# Patient Record
Sex: Male | Born: 1965 | Race: White | Hispanic: No | Marital: Married | Smoking: Never smoker
Health system: Southern US, Community
[De-identification: ages and names within clinical notes are randomized; demographics above are authoritative.]

## PROBLEM LIST (undated history)

## (undated) DIAGNOSIS — E039 Hypothyroidism, unspecified: Secondary | ICD-10-CM

## (undated) DIAGNOSIS — R0602 Shortness of breath: Secondary | ICD-10-CM

## (undated) DIAGNOSIS — I209 Angina pectoris, unspecified: Secondary | ICD-10-CM

## (undated) DIAGNOSIS — K219 Gastro-esophageal reflux disease without esophagitis: Secondary | ICD-10-CM

## (undated) DIAGNOSIS — R5383 Other fatigue: Secondary | ICD-10-CM

## (undated) DIAGNOSIS — R0600 Dyspnea, unspecified: Secondary | ICD-10-CM

## (undated) HISTORY — DX: Shortness of breath: R06.02

## (undated) HISTORY — PX: NO PAST SURGERIES: SHX2092

## (undated) HISTORY — DX: Other fatigue: R53.83

---

## 2008-12-23 ENCOUNTER — Encounter: Admission: RE | Admit: 2008-12-23 | Discharge: 2008-12-23 | Payer: Self-pay | Admitting: Endocrinology

## 2012-06-16 ENCOUNTER — Encounter: Payer: Self-pay | Admitting: Cardiology

## 2012-06-17 ENCOUNTER — Encounter: Payer: Self-pay | Admitting: *Deleted

## 2015-02-14 ENCOUNTER — Other Ambulatory Visit: Payer: Self-pay

## 2015-02-14 DIAGNOSIS — I83811 Varicose veins of right lower extremities with pain: Secondary | ICD-10-CM

## 2015-03-13 ENCOUNTER — Encounter: Payer: Self-pay | Admitting: Vascular Surgery

## 2015-03-14 ENCOUNTER — Encounter: Payer: Self-pay | Admitting: Vascular Surgery

## 2015-03-14 ENCOUNTER — Encounter (HOSPITAL_COMMUNITY): Payer: Self-pay

## 2015-04-19 ENCOUNTER — Ambulatory Visit: Payer: Self-pay | Admitting: Sports Medicine

## 2015-06-13 ENCOUNTER — Encounter: Payer: Self-pay | Admitting: *Deleted

## 2015-06-15 ENCOUNTER — Encounter: Payer: Self-pay | Admitting: Cardiovascular Disease

## 2015-06-15 ENCOUNTER — Ambulatory Visit (INDEPENDENT_AMBULATORY_CARE_PROVIDER_SITE_OTHER): Payer: 59 | Admitting: Cardiovascular Disease

## 2015-06-15 VITALS — BP 112/64 | HR 52 | Ht 70.0 in | Wt 168.0 lb

## 2015-06-15 DIAGNOSIS — R5382 Chronic fatigue, unspecified: Secondary | ICD-10-CM

## 2015-06-15 DIAGNOSIS — R5383 Other fatigue: Secondary | ICD-10-CM | POA: Diagnosis not present

## 2015-06-15 DIAGNOSIS — R0602 Shortness of breath: Secondary | ICD-10-CM | POA: Diagnosis not present

## 2015-06-15 DIAGNOSIS — R002 Palpitations: Secondary | ICD-10-CM

## 2015-06-15 DIAGNOSIS — R0600 Dyspnea, unspecified: Secondary | ICD-10-CM

## 2015-06-15 DIAGNOSIS — R5381 Other malaise: Secondary | ICD-10-CM

## 2015-06-15 NOTE — Progress Notes (Signed)
Cardiology Office Note   Date:  06/15/2015   ID:  Nathaniel Oliver, DOB Oct 31, 1965, MRN [id number]  PCP:  PROVIDER NOT IN SYSTEM  Cardiologist:   Anyely Cunning, Wonda Cheng, MD   Chief Complaint  Patient presents with  . Palpitations   Problem list: 1. Palpitations 2. Shortness of breath with exercise 3. hypercalcemia   History of Present Illness: Nathaniel Oliver is a 49 y.o. male who presents for palpitations and fatigue Started on late June. Noticed extreme fatigue with cycling .  Is a competitive cyclist. Previously rode for [employer].  Also lots of fatigue early in the morning .  Has cut back his cycling quite a bit.   Now has profound fatigue 45 minutes into  A ride.  3 months ago, he could ride for 3-4 hours at a time without problems.   Has had some palpitations that last for a few seconds  Was hypercalcemic at the time   Non smoker Drinks wine occasionally .     Past Medical History  Diagnosis Date  . Chest pain   . SOB (shortness of breath)   . Fatigue     History reviewed. No pertinent past surgical history.   Current Outpatient Prescriptions  Medication Sig Dispense Refill  . EPINEPHrine (EPIPEN 2-PAK) 0.3 mg/0.3 mL IJ SOAJ injection Inject 1 Device into the muscle once as needed. Inject into thigh as need for allergic reaction.Seek medical attention after using.    Marland Kitchen levothyroxine (SYNTHROID, LEVOTHROID) 25 MCG tablet Take 25 mcg by mouth daily.    . Multiple Vitamin (MULTIVITAMIN) tablet Take 1 tablet by mouth daily.     No current facility-administered medications for this visit.    Allergies:   Fish allergy    Social History:  The patient  reports that he has never smoked. He does not have any smokeless tobacco history on file. He reports that he drinks about 1.8 oz of alcohol per week.   Family History:  The patient's family history includes Healthy in his brother and sister; Hyperlipidemia in his mother; Hypertension in his mother; Pancreatic cancer in  his father; Stroke in his mother.    ROS:  Please see the history of present illness.    Review of Systems: Constitutional:  denies fever, chills, diaphoresis, appetite change.  He does have profound  fatigue.  HEENT: denies photophobia, eye pain, redness, hearing loss, ear pain, congestion, sore throat, rhinorrhea, sneezing, neck pain, neck stiffness and tinnitus.  Respiratory: denies SOB, DOE, cough, chest tightness, and wheezing.  Cardiovascular: denies chest pain, palpitations and leg swelling.  Gastrointestinal: denies nausea, vomiting, abdominal pain, diarrhea, constipation, blood in stool.  Genitourinary: denies dysuria, urgency, frequency, hematuria, flank pain and difficulty urinating.  Musculoskeletal: denies  myalgias, back pain, joint swelling, arthralgias and gait problem.   Skin: denies pallor, rash and wound.  Neurological: denies dizziness, seizures, syncope, weakness, light-headedness, numbness and headaches.   Hematological: denies adenopathy, easy bruising, personal or family bleeding history.  Psychiatric/ Behavioral: denies suicidal ideation, mood changes, confusion, nervousness, sleep disturbance and agitation.      All other systems are reviewed and negative.    PHYSICAL EXAM: VS:  BP 112/64 mmHg  Pulse 52  Ht 5\' 10"  (1.778 m)  Wt 76.204 kg (168 lb)  BMI 24.11 kg/m2 , BMI Body mass index is 24.11 kg/(m^2). GEN: Well nourished, well developed, in no acute distress. Very fit appearing .  HEENT: normal Neck: no JVD, carotid bruits, or masses Cardiac: RRR; no  murmurs, rubs, or gallops,no edema  Respiratory:  clear to auscultation bilaterally, normal work of breathing GI: soft, nontender, nondistended, + BS MS: no deformity or atrophy Skin: warm and dry, no rash Neuro:  Strength and sensation are intact Psych: normal   EKG:  EKG is ordered today. The ekg ordered today demonstrates  Sinus brady at 52.  Normal ecg    Recent Labs: No results found for  requested labs within last 365 days.    Lipid Panel No results found for: CHOL, TRIG, HDL, CHOLHDL, VLDL, LDLCALC, LDLDIRECT    Wt Readings from Last 3 Encounters:  06/15/15 76.204 kg (168 lb)      Other studies Reviewed: Additional studies/ records that were reviewed today include: . Review of the above records demonstrates:    ASSESSMENT AND PLAN:  1.  Fatigue/shortness breath: [name]  presents with profound fatigue and shortness breath with exercise. He is a competitive cyclist and used to be able to ride 3-4 hours at a time. He now becomes very fatigued 45 minutes. He's not able to ride nearly as far as he could several months ago.   has had some palpitations. He has shortness of breath and fatigue. Fatigues seems to start in his legs.  Think the best approach is to do an echocardiogram to evaluate him for cardiac disease. I will also like to do a stress echo.  I'll see him back in the office in 2-3 months for follow-up visit.    Current medicines are reviewed at length with the patient today.  The patient does not have concerns regarding medicines.  The following changes have been made:  no change  Labs/ tests ordered today include:   Orders Placed This Encounter  Procedures  . EKG 12-Lead  . Echocardiogram  . Echo stress     Disposition:   FU with me in 2-3 months     Lynisha Osuch, Wonda Cheng, MD  06/15/2015 11:29 AM    [facility] [place], [place], [place]  [address] Phone: [phone]; Fax: [phone]   [facility]  [address] [place], [place]  [address] [phone]   Fax [phone]

## 2015-06-15 NOTE — Patient Instructions (Signed)
Medication Instructions:  Your physician recommends that you continue on your current medications as directed. Please refer to the Current Medication list given to you today.   Labwork: None Ordered   Testing/Procedures: Your physician has requested that you have an echocardiogram. Echocardiography is a painless test that uses sound waves to create images of your heart. It provides your doctor with information about the size and shape of your heart and how well your heart's chambers and valves are working. This procedure takes approximately one hour. There are no restrictions for this procedure.  Your physician has requested that you have a stress echocardiogram. For further information please visit [email or web address]. Please follow instruction sheet as given.   Follow-Up: Your physician recommends that you schedule a follow-up appointment in: 2-3 months with Dr. Acie Fredrickson.

## 2015-06-27 ENCOUNTER — Ambulatory Visit (HOSPITAL_COMMUNITY): Payer: 59 | Attending: Cardiovascular Disease

## 2015-06-27 ENCOUNTER — Other Ambulatory Visit: Payer: Self-pay

## 2015-06-27 ENCOUNTER — Ambulatory Visit (HOSPITAL_BASED_OUTPATIENT_CLINIC_OR_DEPARTMENT_OTHER): Payer: 59

## 2015-06-27 ENCOUNTER — Other Ambulatory Visit: Payer: Self-pay | Admitting: Cardiovascular Disease

## 2015-06-27 DIAGNOSIS — R079 Chest pain, unspecified: Secondary | ICD-10-CM

## 2015-06-27 DIAGNOSIS — R5383 Other fatigue: Secondary | ICD-10-CM | POA: Diagnosis not present

## 2015-06-27 DIAGNOSIS — R0600 Dyspnea, unspecified: Secondary | ICD-10-CM | POA: Diagnosis not present

## 2015-06-27 DIAGNOSIS — R002 Palpitations: Secondary | ICD-10-CM | POA: Diagnosis not present

## 2015-06-28 ENCOUNTER — Telehealth: Payer: Self-pay | Admitting: Nurse Practitioner

## 2015-06-28 NOTE — Telephone Encounter (Signed)
-----   Message from Thayer Headings, MD sent at 06/28/2015  1:37 PM EDT ----- Echo and stress echo are normal . His main symptom is fatigue. He has had his TSH measured recently. Lets refer him to Dr. Mylinda Latina at [facility] .

## 2015-06-28 NOTE — Telephone Encounter (Signed)
Echo and stress echo results were reviewed with patient by Dr. Acie Fredrickson.  I am sending request for referral to Dr. Tye Savoy at [facility] in [place] to [facility] for fatigue.

## 2015-07-04 ENCOUNTER — Other Ambulatory Visit: Payer: Self-pay | Admitting: Nurse Practitioner

## 2015-07-04 DIAGNOSIS — R5383 Other fatigue: Secondary | ICD-10-CM

## 2015-07-04 NOTE — Telephone Encounter (Signed)
Spoke with patient and advised that one of our schedulers called [facility] and was advised that they do not take referrals; advised patient call them to schedule.  I advised him to call back if we can help him in any way.  Patient verbalized understanding and agreement.

## 2015-07-04 NOTE — Telephone Encounter (Signed)
Follow Up ° °Pt returned call/sr  °

## 2015-08-23 ENCOUNTER — Ambulatory Visit: Payer: 59 | Admitting: Cardiovascular Disease

## 2018-05-15 ENCOUNTER — Other Ambulatory Visit: Payer: Self-pay | Admitting: Family Medicine

## 2018-05-15 DIAGNOSIS — R945 Abnormal results of liver function studies: Principal | ICD-10-CM

## 2018-05-15 DIAGNOSIS — R7989 Other specified abnormal findings of blood chemistry: Secondary | ICD-10-CM

## 2018-05-29 ENCOUNTER — Ambulatory Visit
Admission: RE | Admit: 2018-05-29 | Discharge: 2018-05-29 | Disposition: A | Discharge status: Home / Self Care | Payer: BLUE CROSS/BLUE SHIELD | Source: Ambulatory Visit | Attending: Family Medicine | Admitting: Family Medicine

## 2018-05-29 ENCOUNTER — Other Ambulatory Visit: Payer: Self-pay | Admitting: Family Medicine

## 2018-05-29 DIAGNOSIS — R945 Abnormal results of liver function studies: Principal | ICD-10-CM

## 2018-05-29 DIAGNOSIS — R7989 Other specified abnormal findings of blood chemistry: Secondary | ICD-10-CM

## 2021-01-03 ENCOUNTER — Inpatient Hospital Stay: Payer: No Typology Code available for payment source | Attending: Hematology | Admitting: Hematology

## 2021-01-03 ENCOUNTER — Other Ambulatory Visit: Payer: Self-pay

## 2021-01-03 ENCOUNTER — Inpatient Hospital Stay: Payer: No Typology Code available for payment source

## 2021-01-03 ENCOUNTER — Telehealth: Payer: Self-pay | Admitting: Hematology

## 2021-01-03 VITALS — BP 118/72 | HR 63 | Temp 97.5°F | Resp 18 | Ht 70.0 in | Wt 171.3 lb

## 2021-01-03 DIAGNOSIS — C880 Waldenstrom macroglobulinemia: Secondary | ICD-10-CM

## 2021-01-03 LAB — CBC WITH DIFFERENTIAL/PLATELET
Abs Immature Granulocytes: 0.01 10*3/uL (ref 0.00–0.07)
Basophils Absolute: 0.1 10*3/uL (ref 0.0–0.1)
Basophils Relative: 1 %
Eosinophils Absolute: 0.1 10*3/uL (ref 0.0–0.5)
Eosinophils Relative: 1 %
HCT: 41.3 % (ref 39.0–52.0)
Hemoglobin: 14.4 g/dL (ref 13.0–17.0)
Immature Granulocytes: 0 %
Lymphocytes Relative: 16 %
Lymphs Abs: 1.4 10*3/uL (ref 0.7–4.0)
MCH: 32.1 pg (ref 26.0–34.0)
MCHC: 34.9 g/dL (ref 30.0–36.0)
MCV: 92 fL (ref 80.0–100.0)
Monocytes Absolute: 0.6 10*3/uL (ref 0.1–1.0)
Monocytes Relative: 7 %
Neutro Abs: 6.4 10*3/uL (ref 1.7–7.7)
Neutrophils Relative %: 75 %
Platelets: 303 10*3/uL (ref 150–400)
RBC: 4.49 MIL/uL (ref 4.22–5.81)
RDW: 11.9 % (ref 11.5–15.5)
WBC: 8.5 10*3/uL (ref 4.0–10.5)
nRBC: 0 % (ref 0.0–0.2)

## 2021-01-03 LAB — CMP (CANCER CENTER ONLY)
ALT: 15 U/L (ref 0–44)
AST: 21 U/L (ref 15–41)
Albumin: 4.2 g/dL (ref 3.5–5.0)
Alkaline Phosphatase: 52 U/L (ref 38–126)
Anion gap: 10 (ref 5–15)
BUN: 11 mg/dL (ref 6–20)
CO2: 27 mmol/L (ref 22–32)
Calcium: 9.6 mg/dL (ref 8.9–10.3)
Chloride: 103 mmol/L (ref 98–111)
Creatinine: 1.02 mg/dL (ref 0.61–1.24)
GFR, Estimated: >60 mL/min (ref >60)
Glucose, Bld: 108 mg/dL — ABNORMAL HIGH (ref 70–99)
Potassium: 3.9 mmol/L (ref 3.5–5.1)
Sodium: 140 mmol/L (ref 135–145)
Total Bilirubin: 1 mg/dL (ref 0.3–1.2)
Total Protein: 8.2 g/dL — ABNORMAL HIGH (ref 6.5–8.1)

## 2021-01-03 LAB — APTT: aPTT: 29 seconds (ref 24–36)

## 2021-01-03 LAB — LACTATE DEHYDROGENASE: LDH: 128 U/L (ref 98–192)

## 2021-01-03 LAB — SEDIMENTATION RATE: Sed Rate: 54 mm/hr — ABNORMAL HIGH (ref 0–16)

## 2021-01-03 LAB — FIBRINOGEN: Fibrinogen: 274 mg/dL (ref 210–475)

## 2021-01-03 LAB — PROTIME-INR
INR: 1.1 (ref 0.8–1.2)
Prothrombin Time: 13.8 seconds (ref 11.4–15.2)

## 2021-01-03 MED ORDER — TRAZODONE HCL 50 MG PO TABS: 50.0000 mg | ORAL_TABLET | Freq: Every evening | ORAL | 0 refills | Status: DC | PRN

## 2021-01-03 NOTE — Progress Notes (Signed)
HEMATOLOGY/ONCOLOGY CONSULTATION NOTE  Date of Service: 01/03/2021  Patient Care Team: System, Provider Not In as PCP - General  CHIEF COMPLAINTS/PURPOSE OF CONSULTATION:  Abnormal M-Spike and IgM levels  HISTORY OF PRESENTING ILLNESS:   Nathaniel Oliver is a wonderful 55 y.o. male who has been referred to [place] by Roe Coombs, [place] at [place] for evaluation and management of abnormal serum protein. The pt reports that he is doing well overall. We are joined today by his wife.  The pt reports that he has a history of hypothyroidism. The pt notes that he had Shingles in July 2021 and everything was normal prior to this. The pt notes he had visited the doctor one week prior and felt great. This was fairly bad on his back and was not triggered by any steroid use. The pt notes he had recently had more work stress and personal stress with addition of a puppy. The pt notes he recovered in 4 weeks. The pt notes that again in November he experienced a cold for two days. The pt then notes that he felt a bronchial spasm with fluttering in his heart in December, helped with antibiotics. The pt notes he never used to be sick prior. The pt notes that his urine has also been brown and foamy persistent since around the same time as Shingles. The pt notes this is worse in the morning and night. He notes this is different than what he was used to, but not overly foamy. The pt notes that since these recent finding, he has not slept well and has been very anxious regarding the results and potential diagnosis. The pt also notes that he had a past medical issue with lack of recovery in 2016 and had to stop his competitive cycling. He would ride for an hour-1.5 hours then get extremely fatigued when he would push himself. This did not affect his regular day life. The pt notes that on blood work he had EBV antibody elevated. They were checking for chronic fatigue syndrome in doing all of these lab tests. He notes  they could not explain his extreme fatigue and change from baseline. The pt notes that since then he has been on many vitamins and supplements that have been helping him. The pt notes that currently he lifts weight and is feeling fine. The pt notes bronchial allergies as a kid, but no asthma. He denies needing inhalers for this. The pt has been having hypothyroidism since 2010, but is unknown of what the cause is. The pt denies any medication allergies or surgeries in the past. The pt notes he has been on testosterone since 2016 when he was experiencing fatigue. He is using .5cc of the intramuscular injections every 4-5 days. The pt notes that his integrative doctor is monitoring this and his levels are now in the 700s. The pt notes his father passed away from pancreatic cancer in his mid 10s, mother had hypertension and high cholesterol and history of familial stroke. The pt notes his father smoked over a pack a day for an extended period of time. The pt has had the [facility] testing that checks for mutations and found nothing detected.   The pt denies any history of smoking and notes he would occasionally drink beers on weekends. He notes that in his adult life he has been very healthy and proactive. The pt notes that the fluttering is improved, but occurs when he is around his dogs. The pt has three big  dogs that are downstairs dogs. The pt notes that he has had GERD since his mid 37s and gets bloated after eating. 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  Lab results 12/21/2020 showed an m-spike of 1.8 and IgM of 2,397. 12/21/2020 CMP WNL. 11/20/2020 CBC WNL.  On review of systems, pt reports bloating, difficulty sleeping and denies fatigue, fevers, chills, night sweats, sudden weight loss, new lumps/bumps, skin rashes, persistent pain in area of shingles, change in vision, acute headaches, floaters, sudden change in vision, acute SOB, chest pain, leg swelling, abdominal pain, back  pain, acute bone pains, testicular pain/swelling, and any other symptoms.  MEDICAL HISTORY:  Past Medical History:  Diagnosis Date  . Chest pain   . Fatigue   . SOB (shortness of breath)     SURGICAL HISTORY: No past surgical history on file.  SOCIAL HISTORY: Social History   Socioeconomic History  . Marital status: Married    Spouse name: Not on file  . Number of children: Not on file  . Years of education: Not on file  . Highest education level: Not on file  Occupational History  . Not on file  Tobacco Use  . Smoking status: Never Smoker  . Smokeless tobacco: Not on file  Substance and Sexual Activity  . Alcohol use: Yes    Alcohol/week: 3.0 standard drinks    Types: 3 Cans of beer per week  . Drug use: Not on file  . Sexual activity: Not on file  Other Topics Concern  . Not on file  Social History Narrative  . Not on file   Social Determinants of Health   Financial Resource Strain: Not on file  Food Insecurity: Not on file  Transportation Needs: Not on file  Physical Activity: Not on file  Stress: Not on file  Social Connections: Not on file  Intimate Partner Violence: Not on file    FAMILY HISTORY: Family History  Problem Relation Age of Onset  . Hypertension Mother   . Stroke Mother   . Hyperlipidemia Mother   . Pancreatic cancer Father   . Healthy Sister   . Healthy Brother     ALLERGIES:  is allergic to fish allergy.  MEDICATIONS:  Current Outpatient Medications  Medication Sig Dispense Refill  . EPINEPHrine (EPIPEN 2-PAK) 0.3 mg/0.3 mL IJ SOAJ injection Inject 1 Device into the muscle once as needed. Inject into thigh as need for allergic reaction.Seek medical attention after using.    Marland Kitchen levothyroxine (SYNTHROID, LEVOTHROID) 25 MCG tablet Take 25 mcg by mouth daily.    . Multiple Vitamin (MULTIVITAMIN) tablet Take 1 tablet by mouth daily.     No current facility-administered medications for this visit.    REVIEW OF SYSTEMS:   10 Point  review of Systems was done is negative except as noted above.  PHYSICAL EXAMINATION: ECOG PERFORMANCE STATUS: 1 - Symptomatic but completely ambulatory  . Vitals:   01/03/21 1253  BP: 118/72  Pulse: 63  Resp: 18  Temp: (!) 97.5 F (36.4 C)  SpO2: 99%   Filed Weights   01/03/21 1253  Weight: 171 lb 4.8 oz (77.7 kg)   .Body mass index is 24.58 kg/m.  GENERAL:alert, in no acute distress and comfortable SKIN: no acute rashes, no significant lesions EYES: conjunctiva are pink and non-injected, sclera anicteric OROPHARYNX: MMM, no exudates, no oropharyngeal erythema or ulceration NECK: supple, no JVD LYMPH:  no palpable lymphadenopathy in the cervical, axillary or inguinal regions LUNGS:  clear to auscultation b/l with normal respiratory effort HEART: regular rate & rhythm ABDOMEN:  normoactive bowel sounds , non tender, not distended. Extremity: no pedal edema PSYCH: alert & oriented x 3 with fluent speech NEURO: no focal motor/sensory deficits  LABORATORY DATA:  I have reviewed the data as listed  .No flowsheet data found.  .No flowsheet data found.   RADIOGRAPHIC STUDIES: I have personally reviewed the radiological images as listed and agreed with the findings in the report. No results found.  ASSESSMENT & PLAN:   55 yo with   1) IgM kappa Monoclonal paraproteinemia r/o Waldenstroms macroglobulinemia No symptoms suggestive of hyperviscosity syndorme Normal CBC with no cytopenias Normal CMP PLAN: -Advised pt that elevated EBV antibody is not uncommon if somebody had infection of Mono when he was younger and not concerning. These levels stay chronically elevated and can be elevated due to immune suppression as well. -Advised pt that most common cause of hypothyroidism is Hashimoto's Thyroidism. -Discussed albumins versus globulins. Advised pt that usually they are in balance.  -Discussed pt's lab findings-- he has an m-spike of 1.8 and IgM monoclonal elevation of  2,397. This tells [place] that there is one protein much more elevated than the others and that is the IgM Kappa in the pt's case.  -Advised pt that these findings are most suggestive of a WBC overgrowing and making more of one protein. The amount is significant enough to get further testing and potential treatment. -Discussed IgM MGUS-- no constitutional symptoms (fatigue, fevers, chills, weight loss, enlarged spleen). -Discussed hyperviscosity that can cause changes in vision and breathing. -Advised pt that the PET scan will allow [place] to observe the lymphocytes and r/o or diagnosis lymphoplasmacytic lymphoma, MZL, CLL. -Advised pt that these findings are not indicative of Multiple Myeloma due to IgM Kappa. The bone marrow would show plasma cells as opposed to lymphocytes. -Advised pt that IgM plus lymphoplasmacytic lymphoma would be indicative of Waldenstrom's Lymphoma.  -Discussed the spectrum of Smoldering versus Active and meeting the criteria for treatment (symptoms, hyperviscosity, affecting blood counts, neuropathy). -Recommended pt keep his testosterone levels in the 300-400s as opposed to 800-900s. -Recommended pt attempt to move eye appointment earlier than May 4 to look for hyperviscosity in eye. -Recommended pt de-stress, stay active, eat healthy, and get ample sleep. -Recommended pt drink 48-64 oz water daily. -Will get further labs today. -Will get 24 hr UPEP today. -Will get ECHO for baseline. -Will get PET CT and Bm Bx in 1 week. -Start Baby ASA daily. -Will see back in 11 days. -Rx Trazadone.    FOLLOW UP: Labs today PET/CT in 1 week CT bone marrow aspiration and biopsy in 1 week ECHO in 1 week RTC with Dr Irene Limbo about 11-12 days   All of the patients questions were answered with apparent satisfaction. The patient knows to call the clinic with any problems, questions or concerns.  I spent 60 minutes counseling the patient face to face. The total time spent in the appointment  was 80 minutes and more than 50% was on counseling and direct patient cares.    Sullivan Lone MD [place] AAHIVMS [facility] Hematology/Oncology Physician [facility]  (Office):       [phone] (Work cell):  [phone] (Fax):           [phone]  01/03/2021 12:51 PM  I, Reinaldo Raddle, am acting as scribe for Dr. Sullivan Lone, MD.   .I have reviewed the above documentation for accuracy and completeness,  and I agree with the above. Brunetta Genera MD

## 2021-01-03 NOTE — Telephone Encounter (Signed)
Received a new hem referral from [facility] for abnormal serum protein. Mr. Nathaniel Oliver has been cld and scheduled to see Dr. Irene Limbo today at 1pm. Pt aware to arrive 20 minutes early.

## 2021-01-04 ENCOUNTER — Encounter: Payer: Self-pay | Admitting: Hematology

## 2021-01-04 ENCOUNTER — Telehealth: Payer: Self-pay | Admitting: Hematology

## 2021-01-04 LAB — KAPPA/LAMBDA LIGHT CHAINS
Kappa free light chain: 18.6 mg/L (ref 3.3–19.4)
Kappa, lambda light chain ratio: 2.35 — ABNORMAL HIGH (ref 0.26–1.65)
Lambda free light chains: 7.9 mg/L (ref 5.7–26.3)

## 2021-01-04 LAB — VISCOSITY, SERUM: Viscosity, Serum: 2 rel.saline (ref 1.4–2.1)

## 2021-01-04 LAB — BETA 2 MICROGLOBULIN, SERUM: Beta-2 Microglobulin: 1.5 mg/L (ref 0.6–2.4)

## 2021-01-04 NOTE — Telephone Encounter (Signed)
R/s appt per 3/31 sch msg. Pt aware.  

## 2021-01-05 LAB — MULTIPLE MYELOMA PANEL, SERUM
Albumin SerPl Elph-Mcnc: 4.2 g/dL (ref 2.9–4.4)
Albumin/Glob SerPl: 1.1 (ref 0.7–1.7)
Alpha 1: 0.2 g/dL (ref 0.0–0.4)
Alpha2 Glob SerPl Elph-Mcnc: 0.5 g/dL (ref 0.4–1.0)
B-Globulin SerPl Elph-Mcnc: 0.8 g/dL (ref 0.7–1.3)
Gamma Glob SerPl Elph-Mcnc: 2.4 g/dL — ABNORMAL HIGH (ref 0.4–1.8)
Globulin, Total: 3.9 g/dL (ref 2.2–3.9)
IgA: 142 mg/dL (ref 90–386)
IgG (Immunoglobin G), Serum: 774 mg/dL (ref 603–1613)
IgM (Immunoglobulin M), Srm: 2239 mg/dL — ABNORMAL HIGH (ref 20–172)
M Protein SerPl Elph-Mcnc: 1.8 g/dL — ABNORMAL HIGH
Total Protein ELP: 8.1 g/dL (ref 6.0–8.5)

## 2021-01-08 ENCOUNTER — Encounter: Payer: Self-pay | Admitting: Hematology

## 2021-01-10 ENCOUNTER — Encounter (INDEPENDENT_AMBULATORY_CARE_PROVIDER_SITE_OTHER): Payer: Self-pay | Admitting: Otolaryngology

## 2021-01-10 ENCOUNTER — Other Ambulatory Visit: Payer: Self-pay | Admitting: Hematology

## 2021-01-10 ENCOUNTER — Other Ambulatory Visit: Payer: Self-pay | Admitting: *Deleted

## 2021-01-10 ENCOUNTER — Ambulatory Visit (INDEPENDENT_AMBULATORY_CARE_PROVIDER_SITE_OTHER): Payer: No Typology Code available for payment source | Admitting: Otolaryngology

## 2021-01-10 ENCOUNTER — Other Ambulatory Visit: Payer: Self-pay

## 2021-01-10 VITALS — Temp 97.5°F

## 2021-01-10 DIAGNOSIS — D170 Benign lipomatous neoplasm of skin and subcutaneous tissue of head, face and neck: Secondary | ICD-10-CM

## 2021-01-10 DIAGNOSIS — H6123 Impacted cerumen, bilateral: Secondary | ICD-10-CM

## 2021-01-10 DIAGNOSIS — C880 Waldenstrom macroglobulinemia: Secondary | ICD-10-CM

## 2021-01-10 NOTE — Progress Notes (Signed)
HPI: Nathaniel Oliver is a 55 y.o. male who presents is referred by [facility] dermatology for evaluation of right postauricular head mass or nodule.  Patient has noticed this for more than a year and has gradually gotten larger..  It causes discomfort when he wears his glasses.  He was seen at [facility] dermatology and was referred here. He is otherwise healthy.  Past Medical History:  Diagnosis Date  . Chest pain   . Fatigue   . SOB (shortness of breath)    No past surgical history on file. Social History   Socioeconomic History  . Marital status: Married    Spouse name: Not on file  . Number of children: Not on file  . Years of education: Not on file  . Highest education level: Not on file  Occupational History  . Not on file  Tobacco Use  . Smoking status: Never Smoker  . Smokeless tobacco: Never Used  Substance and Sexual Activity  . Alcohol use: Yes    Alcohol/week: 3.0 standard drinks    Types: 3 Cans of beer per week  . Drug use: Not on file  . Sexual activity: Not on file  Other Topics Concern  . Not on file  Social History Narrative  . Not on file   Social Determinants of Health   Financial Resource Strain: Not on file  Food Insecurity: Not on file  Transportation Needs: Not on file  Physical Activity: Not on file  Stress: Not on file  Social Connections: Not on file   Family History  Problem Relation Age of Onset  . Hypertension Mother   . Stroke Mother   . Hyperlipidemia Mother   . Pancreatic cancer Father   . Healthy Sister   . Healthy Brother    Allergies  Allergen Reactions  . Fish Allergy Swelling   Prior to Admission medications   Medication Sig Start Date End Date Taking? Authorizing Provider  EPINEPHrine 0.3 mg/0.3 mL IJ SOAJ injection Inject 1 Device into the muscle once as needed. Inject into thigh as need for allergic reaction.Seek medical attention after using. Patient not taking: Reported on 01/03/2021 01/24/15   [provider]   levothyroxine (SYNTHROID, LEVOTHROID) 25 MCG tablet Take 25 mcg by mouth daily. Patient not taking: Reported on 01/03/2021    [provider]  Multiple Vitamin (MULTIVITAMIN) tablet Take 1 tablet by mouth daily.    [provider]  testosterone cypionate (DEPOTESTOSTERONE CYPIONATE) 200 MG/ML injection SMARTSIG:0.3 Milliliter(s) IM Twice a Week 10/01/20   [provider]  thyroid (ARMOUR) 30 MG tablet Take by mouth. 04/21/19   [provider]  traZODone (DESYREL) 50 MG tablet Take 1-2 tablets (50-100 mg total) by mouth at bedtime as needed for sleep. 01/03/21   Brunetta Genera, MD     Positive ROS: Otherwise negative  All other systems have been reviewed and were otherwise negative with the exception of those mentioned in the HPI and as above.  Physical Exam: Constitutional: Alert, well-appearing, no acute distress Ears: External ears without lesions or tenderness. Ear canals are clear bilaterally with intact, clear TMs.  He has some wax buildup in the right ear canal compared to the left but uses Q-tips and had pushed the wax deep within the ear canal.  This was cleaned with suction and forceps.  TMs were clear bilaterally. Nasal: External nose without lesions. Septum with mild deformity.. Clear nasal passages Oral: Lips and gums without lesions. Tongue and palate mucosa without lesions. Posterior oropharynx  clear. Neck: No palpable adenopathy or masses.  No adenopathy in the neck on either side. Scalp: On examination patient has a 3-1/2 cm soft subcutaneous mass behind the right ear pinna on palpation of this it is consistent with a probable lipoma as it is soft and mobile. Respiratory: Breathing comfortably  Skin: No facial/neck lesions or rash noted.  Cerumen impaction removal  Date/Time: 01/10/2021 11:45 AM Performed by: Rozetta Nunnery, MD Authorized by: Rozetta Nunnery, MD   Consent:    Consent obtained:  Verbal   Consent given  by:  Patient   Risks discussed:  Pain and bleeding Procedure details:    Location:  L ear and R ear   Procedure type: curette, suction and forceps   Post-procedure details:    Inspection:  TM intact and canal normal   Hearing quality:  Improved   Patient tolerance of procedure:  Tolerated well, no immediate complications Comments:     Both ear canals were cleaned with suction curette and forceps.  TMs were clear otherwise.    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  Plan: Recommended not using Q-tips for his ears as he is pushed the wax deep within the ear canal.  This was cleaned in the office. Discussed with him concerning excision of the lipoma under local anesthesia.  Briefly discussed with him that this will have to be done in the operating room and cannot be done in the office as this is a sterile procedure which I do not perform in the office.  But it can easily be done in local anesthesia. Because of the cost of using an OR suggested that he could check with his dermatologist to see if they can perform this in the office or with one of the plastic surgeons who do procedures in their office.   Radene Journey, MD   CC:

## 2021-01-10 NOTE — Telephone Encounter (Signed)
90 day prescription requested - please review

## 2021-01-11 ENCOUNTER — Other Ambulatory Visit: Payer: Self-pay | Admitting: Hematology

## 2021-01-11 MED ORDER — LORAZEPAM 0.5 MG PO TABS: 0.5000 mg | ORAL_TABLET | Freq: Three times a day (TID) | ORAL | 0 refills | Status: DC

## 2021-01-12 LAB — UPEP/UIFE/LIGHT CHAINS/TP, 24-HR UR
% BETA, Urine: 0 %
ALPHA 1 URINE: 0 %
Albumin, U: 0 %
Alpha 2, Urine: 0 %
Free Kappa Lt Chains,Ur: 1.75 mg/L (ref 1.17–86.46)
Free Kappa/Lambda Ratio: >2.61 (ref 1.83–14.26)
Free Lambda Lt Chains,Ur: <0.67 mg/L (ref 0.27–15.21)
GAMMA GLOBULIN URINE: 0 %
Total Protein, Urine-Ur/day: <100 mg/24 hr (ref 30–150)
Total Protein, Urine: <4 mg/dL
Total Volume: 2500

## 2021-01-15 ENCOUNTER — Ambulatory Visit: Payer: No Typology Code available for payment source | Admitting: Hematology

## 2021-01-18 ENCOUNTER — Other Ambulatory Visit: Payer: Self-pay

## 2021-01-18 ENCOUNTER — Ambulatory Visit (HOSPITAL_COMMUNITY)
Admission: RE | Admit: 2021-01-18 | Discharge: 2021-01-18 | Disposition: A | Discharge status: Home / Self Care | Payer: No Typology Code available for payment source | Source: Ambulatory Visit | Attending: Hematology | Admitting: Hematology

## 2021-01-18 DIAGNOSIS — C880 Waldenstrom macroglobulinemia: Secondary | ICD-10-CM | POA: Insufficient documentation

## 2021-01-18 DIAGNOSIS — I517 Cardiomegaly: Secondary | ICD-10-CM

## 2021-01-18 LAB — ECHOCARDIOGRAM COMPLETE
Area-P 1/2: 2.11 cm2
S' Lateral: 3.5 cm

## 2021-01-18 NOTE — Progress Notes (Signed)
2D Echocardiogram has been performed.  Nathaniel Oliver 01/18/2021, 9:32 AM

## 2021-01-22 ENCOUNTER — Ambulatory Visit (HOSPITAL_COMMUNITY): Payer: No Typology Code available for payment source

## 2021-01-23 ENCOUNTER — Encounter (HOSPITAL_COMMUNITY): Payer: Self-pay

## 2021-01-23 ENCOUNTER — Ambulatory Visit (HOSPITAL_COMMUNITY)
Admission: RE | Admit: 2021-01-23 | Discharge: 2021-01-23 | Disposition: A | Discharge status: Home / Self Care | Payer: No Typology Code available for payment source | Source: Ambulatory Visit | Attending: Hematology | Admitting: Hematology

## 2021-01-23 ENCOUNTER — Other Ambulatory Visit: Payer: Self-pay

## 2021-01-23 DIAGNOSIS — D472 Monoclonal gammopathy: Secondary | ICD-10-CM | POA: Insufficient documentation

## 2021-01-23 DIAGNOSIS — K219 Gastro-esophageal reflux disease without esophagitis: Secondary | ICD-10-CM | POA: Diagnosis not present

## 2021-01-23 DIAGNOSIS — C880 Waldenstrom macroglobulinemia: Secondary | ICD-10-CM | POA: Diagnosis not present

## 2021-01-23 DIAGNOSIS — Z79899 Other long term (current) drug therapy: Secondary | ICD-10-CM | POA: Diagnosis not present

## 2021-01-23 DIAGNOSIS — Z79890 Hormone replacement therapy (postmenopausal): Secondary | ICD-10-CM | POA: Insufficient documentation

## 2021-01-23 DIAGNOSIS — R779 Abnormality of plasma protein, unspecified: Secondary | ICD-10-CM | POA: Diagnosis present

## 2021-01-23 DIAGNOSIS — Z800 Family history of malignant neoplasm of digestive organs: Secondary | ICD-10-CM | POA: Insufficient documentation

## 2021-01-23 DIAGNOSIS — E039 Hypothyroidism, unspecified: Secondary | ICD-10-CM | POA: Diagnosis not present

## 2021-01-23 HISTORY — DX: Hypothyroidism, unspecified: E03.9

## 2021-01-23 HISTORY — DX: Gastro-esophageal reflux disease without esophagitis: K21.9

## 2021-01-23 HISTORY — DX: Dyspnea, unspecified: R06.00

## 2021-01-23 HISTORY — DX: Angina pectoris, unspecified: I20.9

## 2021-01-23 LAB — CBC WITH DIFFERENTIAL/PLATELET
Abs Immature Granulocytes: 0.01 10*3/uL (ref 0.00–0.07)
Basophils Absolute: 0.1 10*3/uL (ref 0.0–0.1)
Basophils Relative: 1 %
Eosinophils Absolute: 0.1 10*3/uL (ref 0.0–0.5)
Eosinophils Relative: 2 %
HCT: 42 % (ref 39.0–52.0)
Hemoglobin: 14.4 g/dL (ref 13.0–17.0)
Immature Granulocytes: 0 %
Lymphocytes Relative: 19 %
Lymphs Abs: 1.1 10*3/uL (ref 0.7–4.0)
MCH: 32.6 pg (ref 26.0–34.0)
MCHC: 34.3 g/dL (ref 30.0–36.0)
MCV: 95 fL (ref 80.0–100.0)
Monocytes Absolute: 0.5 10*3/uL (ref 0.1–1.0)
Monocytes Relative: 9 %
Neutro Abs: 3.9 10*3/uL (ref 1.7–7.7)
Neutrophils Relative %: 69 %
Platelets: 298 10*3/uL (ref 150–400)
RBC: 4.42 MIL/uL (ref 4.22–5.81)
RDW: 12.3 % (ref 11.5–15.5)
WBC: 5.6 10*3/uL (ref 4.0–10.5)
nRBC: 0 % (ref 0.0–0.2)

## 2021-01-23 MED ORDER — FENTANYL CITRATE (PF) 100 MCG/2ML IJ SOLN
INTRAMUSCULAR | Status: AC | PRN
  Administered 2021-01-23: 50 ug via INTRAVENOUS

## 2021-01-23 MED ORDER — FENTANYL CITRATE (PF) 100 MCG/2ML IJ SOLN
INTRAMUSCULAR | Status: AC
  Filled 2021-01-23: qty 2

## 2021-01-23 MED ORDER — MIDAZOLAM HCL 2 MG/2ML IJ SOLN
INTRAMUSCULAR | Status: AC | PRN
  Administered 2021-01-23: 1 mg via INTRAVENOUS

## 2021-01-23 MED ORDER — MIDAZOLAM HCL 2 MG/2ML IJ SOLN
INTRAMUSCULAR | Status: AC
  Filled 2021-01-23: qty 2

## 2021-01-23 MED ORDER — SODIUM CHLORIDE 0.9 % IV SOLN: INTRAVENOUS | Status: DC

## 2021-01-23 NOTE — Consult Note (Signed)
Chief Complaint: Patient was seen in consultation today for CT-guided bone marrow biopsy  Referring Physician(s): Johney Maine  Supervising Physician: Ruel Favors  Patient Status: [facility] - Out-pt  History of Present Illness: Nathaniel Oliver is a 55 y.o. male with past medical history of hypothyroidism, GERD, and now with abnormal SPEP (abnormal M spike/IgM levels) who presents today for CT-guided biopsy for further evaluation/rule out Waldenstrom's macroglobulinemia.  Past Medical History:  Diagnosis Date  . Chest pain   . Fatigue   . SOB (shortness of breath)     No past surgical history on file.  Allergies: Fish allergy  Medications: Prior to Admission medications   Medication Sig Start Date End Date Taking? Authorizing Provider  EPINEPHrine 0.3 mg/0.3 mL IJ SOAJ injection Inject 1 Device into the muscle once as needed. Inject into thigh as need for allergic reaction.Seek medical attention after using. Patient not taking: Reported on 01/03/2021 01/24/15   [provider]  levothyroxine (SYNTHROID, LEVOTHROID) 25 MCG tablet Take 25 mcg by mouth daily. Patient not taking: Reported on 01/03/2021    [provider]  LORazepam (ATIVAN) 0.5 MG tablet Take 1 tablet (0.5 mg total) by mouth every 8 (eight) hours. 01/11/21   Johney Maine, MD  Multiple Vitamin (MULTIVITAMIN) tablet Take 1 tablet by mouth daily.    [provider]  testosterone cypionate (DEPOTESTOSTERONE CYPIONATE) 200 MG/ML injection SMARTSIG:0.3 Milliliter(s) IM Twice a Week 10/01/20   [provider]  thyroid (ARMOUR) 30 MG tablet Take by mouth. 04/21/19   [provider]     Family History  Problem Relation Age of Onset  . Hypertension Mother   . Stroke Mother   . Hyperlipidemia Mother   . Pancreatic cancer Father   . Healthy Sister   . Healthy Brother     Social History   Socioeconomic History  . Marital status: Married    Spouse name: Not on  file  . Number of children: Not on file  . Years of education: Not on file  . Highest education level: Not on file  Occupational History  . Not on file  Tobacco Use  . Smoking status: Never Smoker  . Smokeless tobacco: Never Used  Substance and Sexual Activity  . Alcohol use: Yes    Alcohol/week: 3.0 standard drinks    Types: 3 Cans of beer per week  . Drug use: Not on file  . Sexual activity: Not on file  Other Topics Concern  . Not on file  Social History Narrative  . Not on file   Social Determinants of Health   Financial Resource Strain: Not on file  Food Insecurity: Not on file  Transportation Needs: Not on file  Physical Activity: Not on file  Stress: Not on file  Social Connections: Not on file     Review of Systems he currently denies fever, headache, chest pain, dyspnea, cough, abdominal/back pain, nausea, vomiting or bleeding.  Vital Signs: Vitals:   01/23/21 0944  BP: 115/75  Pulse: (!) 54  Resp: 16  Temp: 97.9 F (36.6 C)  SpO2: 97%      Physical Exam awake, alert.  Chest clear to auscultation bilaterally.  Heart with slightly bradycardic but regular rhythm.  Abdomen soft, positive bowel sounds, nontender.  No lower extremity edema.  Imaging: ECHOCARDIOGRAM COMPLETE  Result Date: 01/18/2021    ECHOCARDIOGRAM REPORT   Patient Name:   Nathaniel Oliver Date of Exam: 01/18/2021 Medical Rec #:  [id number]    Height:  70.0 in Accession #:    [id number]   Weight:       171.3 lb Date of Birth:  Jul 08, 1966     BSA:          1.954 m Patient Age:    25 years     BP:           118/72 mmHg Patient Gender: M            HR:           63 bpm. Exam Location:  Outpatient Procedure: 2D Echo, Cardiac Doppler, Color Doppler and Strain Analysis Indications:    Cardiomegaly  History:        Patient has prior history of Echocardiogram examinations, most                 recent 06/27/2015. Cardiomegaly; Signs/Symptoms:Shortness of                 Breath and fatigue.  Sonographer:     Dustin Flock Referring Phys: [id number] [place]  1. Left ventricular ejection fraction, by estimation, is 50 to 55%. The left ventricle has low normal function. The left ventricle has no regional wall motion abnormalities. Left ventricular diastolic parameters were normal. The average left ventricular  global longitudinal strain is -23.9 %. The global longitudinal strain is normal.  2. Right ventricular systolic function is normal. The right ventricular size is normal. Tricuspid regurgitation signal is inadequate for assessing PA pressure.  3. The mitral valve is normal in structure. Trivial mitral valve regurgitation. No evidence of mitral stenosis.  4. The aortic valve is tricuspid. Aortic valve regurgitation is not visualized. No aortic stenosis is present.  5. The inferior vena cava is normal in size with greater than 50% respiratory variability, suggesting right atrial pressure of 3 mmHg. FINDINGS  Left Ventricle: Left ventricular ejection fraction, by estimation, is 50 to 55%. The left ventricle has low normal function. The left ventricle has no regional wall motion abnormalities. The average left ventricular global longitudinal strain is -23.9 %. The global longitudinal strain is normal. The left ventricular internal cavity size was normal in size. There is no left ventricular hypertrophy. Left ventricular diastolic parameters were normal. Right Ventricle: The right ventricular size is normal. No increase in right ventricular wall thickness. Right ventricular systolic function is normal. Tricuspid regurgitation signal is inadequate for assessing PA pressure. Left Atrium: Left atrial size was normal in size. Right Atrium: Right atrial size was normal in size. Pericardium: There is no evidence of pericardial effusion. Mitral Valve: The mitral valve is normal in structure. Trivial mitral valve regurgitation. No evidence of mitral valve stenosis. Tricuspid Valve: The tricuspid valve is  normal in structure. Tricuspid valve regurgitation is trivial. No evidence of tricuspid stenosis. Aortic Valve: The aortic valve is tricuspid. Aortic valve regurgitation is not visualized. No aortic stenosis is present. Pulmonic Valve: The pulmonic valve was normal in structure. Pulmonic valve regurgitation is trivial. No evidence of pulmonic stenosis. Aorta: The aortic root is normal in size and structure. Venous: The inferior vena cava is normal in size with greater than 50% respiratory variability, suggesting right atrial pressure of 3 mmHg. IAS/Shunts: No atrial level shunt detected by color flow Doppler.  LEFT VENTRICLE PLAX 2D LVIDd:         5.60 cm  Diastology LVIDs:         3.50 cm  LV e' medial:    7.62 cm/s LV PW:  1.00 cm  LV E/e' medial:  7.3 LV IVS:        1.00 cm  LV e' lateral:   9.36 cm/s LVOT diam:     2.10 cm  LV E/e' lateral: 5.9 LV SV:         74 LV SV Index:   38       2D Longitudinal Strain LVOT Area:     3.46 cm 2D Strain GLS Avg:     -23.9 %  RIGHT VENTRICLE RV Basal diam:  2.40 cm RV S prime:     12.90 cm/s TAPSE (M-mode): 3.2 cm LEFT ATRIUM             Index       RIGHT ATRIUM           Index LA diam:        4.30 cm 2.20 cm/m  RA Area:     18.90 cm LA Vol (A2C):   44.8 ml 22.92 ml/m RA Volume:   56.10 ml  28.71 ml/m LA Vol (A4C):   32.0 ml 16.37 ml/m LA Biplane Vol: 39.4 ml 20.16 ml/m  AORTIC VALVE LVOT Vmax:   107.00 cm/s LVOT Vmean:  66.700 cm/s LVOT VTI:    0.215 m  AORTA Ao Root diam: 3.30 cm MITRAL VALVE MV Area (PHT): 2.11 cm    SHUNTS MV Decel Time: 359 msec    Systemic VTI:  0.22 m MV E velocity: 55.30 cm/s  Systemic Diam: 2.10 cm MV A velocity: 51.40 cm/s MV E/A ratio:  1.08 Cherlynn Kaiser MD Electronically signed by Cherlynn Kaiser MD Signature Date/Time: 01/18/2021/2:49:58 PM    Final     Labs:  CBC: Recent Labs    01/03/21 1442  WBC 8.5  HGB 14.4  HCT 41.3  PLT 303    COAGS: Recent Labs    01/03/21 1443  INR 1.1  APTT 29    BMP: Recent Labs     01/03/21 1442  NA 140  K 3.9  CL 103  CO2 27  GLUCOSE 108*  BUN 11  CALCIUM 9.6  CREATININE 1.02  GFRNONAA >60    LIVER FUNCTION TESTS: Recent Labs    01/03/21 1442  BILITOT 1.0  AST 21  ALT 15  ALKPHOS 52  PROT 8.2*  ALBUMIN 4.2    TUMOR MARKERS: No results for input(s): AFPTM, CEA, CA199, CHROMGRNA in the last 8760 hours.  Assessment and Plan: 55 y.o. male with past medical history of hypothyroidism, GERD, and now with abnormal SPEP (abnormal M spike/IgM levels) who presents today for CT-guided biopsy for further evaluation/rule out Waldenstrom's macroglobulinemia.Risks and benefits of procedure was discussed with the patient  including, but not limited to bleeding, infection, damage to adjacent structures or low yield requiring additional tests.  All of the questions were answered and there is agreement to proceed.  Consent signed and in chart.     Thank you for this interesting consult.  I greatly enjoyed meeting Nathaniel Oliver and look forward to participating in their care.  A copy of this report was sent to the requesting provider on this date.  Electronically Signed: D. Rowe Robert, PA-C 01/23/2021, 9:27 AM   I spent a total of 20 minutes in face to face in clinical consultation, greater than 50% of which was counseling/coordinating care for CT-guided bone marrow biopsy

## 2021-01-23 NOTE — Discharge Instructions (Signed)
Moderate Conscious Sedation, Adult, Care After This sheet gives you information about how to care for yourself after your procedure. Your health care provider may also give you more specific instructions. If you have problems or questions, contact your health care provider. What can I expect after the procedure? After the procedure, it is common to have:  Sleepiness for several hours.  Impaired judgment for several hours.  Difficulty with balance.  Vomiting if you eat too soon. Follow these instructions at home: For the time period you were told by your health care provider:  Rest.  Do not participate in activities where you could fall or become injured.  Do not drive or use machinery.  Do not drink alcohol.  Do not take sleeping pills or medicines that cause drowsiness.  Do not make important decisions or sign legal documents.  Do not take care of children on your own.      Eating and drinking  Follow the diet recommended by your health care provider.  Drink enough fluid to keep your urine pale yellow.  If you vomit: ? Drink water, juice, or soup when you can drink without vomiting. ? Make sure you have little or no nausea before eating solid foods.   General instructions  Take over-the-counter and prescription medicines only as told by your health care provider.  Have a responsible adult stay with you for the time you are told. It is important to have someone help care for you until you are awake and alert.  Do not smoke.  Keep all follow-up visits as told by your health care provider. This is important. Contact a health care provider if:  You are still sleepy or having trouble with balance after 24 hours.  You feel light-headed.  You keep feeling nauseous or you keep vomiting.  You develop a rash.  You have a fever.  You have redness or swelling around the IV site. Get help right away if:  You have trouble breathing.  You have new-onset confusion at  home. Summary  After the procedure, it is common to feel sleepy, have impaired judgment, or feel nauseous if you eat too soon.  Rest after you get home. Know the things you should not do after the procedure.  Follow the diet recommended by your health care provider and drink enough fluid to keep your urine pale yellow.  Get help right away if you have trouble breathing or new-onset confusion at home. This information is not intended to replace advice given to you by your health care provider. Make sure you discuss any questions you have with your health care provider. Document Revised: 01/21/2020 Document Reviewed: 08/19/2019 Elsevier Patient Education  2021 [place]. Needle Biopsy, Care After These instructions tell you how to care for yourself after your procedure. Your doctor may also give you more specific instructions. Call your doctor if you have any problems or questions. What can I expect after the procedure? After the procedure, it is common to have:  Soreness.  Bruising.  Mild pain. Follow these instructions at home:  Return to your normal activities as told by your doctor. Ask your doctor what activities are safe for you.  Take over-the-counter and prescription medicines only as told by your doctor.  Wash your hands with soap and water before you change your bandage (dressing). If you cannot use soap and water, use hand sanitizer.  Follow instructions from your doctor about: ? How to take care of your puncture site. ? When and  how to change your bandage. ? When to remove your bandage.  Check your puncture site every day for signs of infection. Watch for: ? Redness, swelling, or pain. ? Fluid or blood. ? Pus or a bad smell. ? Warmth.  Do not take baths, swim, or use a hot tub until your doctor approves. Ask your doctor if you may take showers. You may only be allowed to take sponge baths.  Keep all follow-up visits as told by your doctor. This is important.    Contact a doctor if you have:  A fever.  Redness, swelling, or pain at the puncture site, and it lasts longer than a few days.  Fluid, blood, or pus coming from the puncture site.  Warmth coming from the puncture site. Get help right away if:  You have a lot of bleeding from the puncture site. Summary  After the procedure, it is common to have soreness, bruising, or mild pain at the puncture site.  Check your puncture site every day for signs of infection, such as redness, swelling, or pain.  Get help right away if you have severe bleeding from your puncture site. This information is not intended to replace advice given to you by your health care provider. Make sure you discuss any questions you have with your health care provider. Document Revised: 03/23/2020 Document Reviewed: 03/23/2020 Elsevier Patient Education  [place].

## 2021-01-23 NOTE — Procedures (Signed)
Interventional Radiology Procedure Note  Procedure: CT BM ASP AND CORE    Complications: None  Estimated Blood Loss:  MIN  Findings: 11 G CORE AND ASP PATH PENDING    M. Daryll Brod, MD

## 2021-01-25 LAB — SURGICAL PATHOLOGY

## 2021-01-26 ENCOUNTER — Ambulatory Visit (HOSPITAL_COMMUNITY): Payer: No Typology Code available for payment source

## 2021-01-28 NOTE — Progress Notes (Signed)
HEMATOLOGY/ONCOLOGY CONSULTATION NOTE  Date of Service: 01/29/2021  Patient Care Team: [name], NP as PCP - General (Nurse Practitioner)  CHIEF COMPLAINTS/PURPOSE OF CONSULTATION:  Abnormal M-Spike and IgM levels  HISTORY OF PRESENTING ILLNESS:   Nathaniel Oliver is a wonderful 55 y.o. male who has been referred to [place] by Georgette Shell, PA at [facility] for evaluation and management of abnormal serum protein. The pt reports that he is doing well overall. We are joined today by his wife.  The pt reports that he has a history of hypothyroidism. The pt notes that he had Shingles in July 2021 and everything was normal prior to this. The pt notes he had visited the doctor one week prior and felt great. This was fairly bad on his back and was not triggered by any steroid use. The pt notes he had recently had more work stress and personal stress with addition of a puppy. The pt notes he recovered in 4 weeks. The pt notes that again in November he experienced a cold for two days. The pt then notes that he felt a bronchial spasm with fluttering in his heart in December, helped with antibiotics. The pt notes he never used to be sick prior. The pt notes that his urine has also been brown and foamy persistent since around the same time as Shingles. The pt notes this is worse in the morning and night. He notes this is different than what he was used to, but not overly foamy. The pt notes that since these recent finding, he has not slept well and has been very anxious regarding the results and potential diagnosis. The pt also notes that he had a past medical issue with lack of recovery in 2016 and had to stop his competitive cycling. He would ride for an hour-1.5 hours then get extremely fatigued when he would push himself. This did not affect his regular day life. The pt notes that on blood work he had EBV antibody elevated. They were checking for chronic fatigue syndrome in doing all of  these lab tests. He notes they could not explain his extreme fatigue and change from baseline. The pt notes that since then he has been on many vitamins and supplements that have been helping him. The pt notes that currently he lifts weight and is feeling fine. The pt notes bronchial allergies as a kid, but no asthma. He denies needing inhalers for this. The pt has been having hypothyroidism since 2010, but is unknown of what the cause is. The pt denies any medication allergies or surgeries in the past. The pt notes he has been on testosterone since 2016 when he was experiencing fatigue. He is using .5cc of the intramuscular injections every 4-5 days. The pt notes that his integrative doctor is monitoring this and his levels are now in the 700s. The pt notes his father passed away from pancreatic cancer in his mid 70s, mother had hypertension and high cholesterol and history of familial stroke. The pt notes his father smoked over a pack a day for an extended period of time. The pt has had the [facility] testing that checks for mutations and found nothing detected.   The pt denies any history of smoking and notes he would occasionally drink beers on weekends. He notes that in his adult life he has been very healthy and proactive. The pt notes that the fluttering is improved, but occurs when he is around his dogs. The pt has  three big dogs that are downstairs dogs. The pt notes that he has had GERD since his mid 64s and gets bloated after eating. 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  Lab results 12/21/2020 showed an m-spike of 1.8 and IgM of 2,397. 12/21/2020 CMP WNL. 11/20/2020 CBC WNL.  On review of systems, pt reports bloating, difficulty sleeping and denies fatigue, fevers, chills, night sweats, sudden weight loss, new lumps/bumps, skin rashes, persistent pain in area of shingles, change in vision, acute headaches, floaters, sudden change in vision, acute SOB, chest pain, leg  swelling, abdominal pain, back pain, acute bone pains, testicular pain/swelling, and any other symptoms.  INTERVAL HISTORY [name] is a wonderful 55 y.o. male who is here today for f/u regarding evaluation and management of abnormal serum protein. The patient's last visit with [place] was on 01/03/2021. The pt reports that he is doing well overall.  The pt reports that he has been sleeping better and this has been improving his symptoms he was experiencing. The pt notes he still has mild intermittent SOB that feels like a closing of the airways, but is not limiting to himself or his activity. The pt notes his anxiety has improved much since the first initial visit. The pt notes that the foamy urination he was experiencing has subsided outside of his first initial urination in the morning.  Of note since the patient's last visit, pt has had CT Bone Marrow Biopsy & Aspiration ([id number]) on 01/23/2021, which revealed "-Monoclonal B cell population identified  -No aberrant T-cell phenotype identified  -See comment  COMMENT:  Flow cytometric analysis of the lymphoid population representing 19% of  all cells in the sample shows a monoclonal, kappa restricted B-cell  population representing 5% of all cells and expressing B-cell antigens  including CD20 with lack of CD5, CD10, CD25, CD38 or CD103 expression.  The pattern is nonspecific but consistent with involvement by a B-cell  lymphoproliferative process. Admixed T-cells do not show an aberrant  phenotype. "  Lab results today 01/23/2021 of CBC w/diff is as follows: all values are WNL.  In review of systems, pt reports intermittent SOB feeling, burning sensation mildly on hands and forearms and denies fevers, chills, difficulty sleeping, bruising, abdominal pain, and any other symptoms.  MEDICAL HISTORY:  Past Medical History:  Diagnosis Date  . Anginal pain ([place])   . Chest pain   . Dyspnea   . Fatigue   . GERD (gastroesophageal reflux  disease)   . Hypothyroidism   . SOB (shortness of breath)     SURGICAL HISTORY: Past Surgical History:  Procedure Laterality Date  . NO PAST SURGERIES      SOCIAL HISTORY: Social History   Socioeconomic History  . Marital status: Married    Spouse name: Not on file  . Number of children: Not on file  . Years of education: Not on file  . Highest education level: Not on file  Occupational History  . Not on file  Tobacco Use  . Smoking status: Never Smoker  . Smokeless tobacco: Never Used  Vaping Use  . Vaping Use: Never used  Substance and Sexual Activity  . Alcohol use: Yes    Alcohol/week: 3.0 standard drinks    Types: 3 Cans of beer per week  . Drug use: Never  . Sexual activity: Not on file  Other Topics Concern  . Not on file  Social History Narrative  . Not on file   Social  Determinants of Health   Financial Resource Strain: Not on file  Food Insecurity: Not on file  Transportation Needs: Not on file  Physical Activity: Not on file  Stress: Not on file  Social Connections: Not on file  Intimate Partner Violence: Not on file    FAMILY HISTORY: Family History  Problem Relation Age of Onset  . Hypertension Mother   . Stroke Mother   . Hyperlipidemia Mother   . Pancreatic cancer Father   . Healthy Sister   . Healthy Brother     ALLERGIES:  is allergic to fish allergy.  MEDICATIONS:  Current Outpatient Medications  Medication Sig Dispense Refill  . diphenhydrAMINE (BENADRYL) 25 MG tablet Take 25 mg by mouth every 6 (six) hours as needed.    Marland Kitchen EPINEPHrine 0.3 mg/0.3 mL IJ SOAJ injection Inject 1 Device into the muscle once as needed. Inject into thigh as need for allergic reaction.Seek medical attention after using. (Patient not taking: Reported on 01/03/2021)    . levothyroxine (SYNTHROID, LEVOTHROID) 25 MCG tablet Take 25 mcg by mouth daily. (Patient not taking: Reported on 01/23/2021)    . LORazepam (ATIVAN) 0.5 MG tablet Take 1 tablet (0.5 mg total)  by mouth every 8 (eight) hours. 30 tablet 0  . Multiple Vitamin (MULTIVITAMIN) tablet Take 1 tablet by mouth daily.    Marland Kitchen testosterone cypionate (DEPOTESTOSTERONE CYPIONATE) 200 MG/ML injection SMARTSIG:0.3 Milliliter(s) IM Twice a Week    . thyroid (ARMOUR) 30 MG tablet Take by mouth.     No current facility-administered medications for this visit.    REVIEW OF SYSTEMS:   10 Point review of Systems was done is negative except as noted above.  PHYSICAL EXAMINATION: ECOG PERFORMANCE STATUS: 1 - Symptomatic but completely ambulatory  Vitals:   01/29/21 1525  BP: 111/68  Pulse: (!) 58  Resp: 17  Temp: 97.8 F (36.6 C)  SpO2: 100%   Filed Weights   01/29/21 1525  Weight: 170 lb 11.2 oz (77.4 kg)   .Body mass index is 24.49 kg/m.  NAD. GENERAL:alert, in no acute distress and comfortable SKIN: no acute rashes, no significant lesions EYES: conjunctiva are pink and non-injected, sclera anicteric OROPHARYNX: MMM, no exudates, no oropharyngeal erythema or ulceration NECK: supple, no JVD LYMPH:  no palpable lymphadenopathy in the cervical, axillary or inguinal regions LUNGS: clear to auscultation b/l with normal respiratory effort HEART: regular rate & rhythm ABDOMEN:  normoactive bowel sounds , non tender, not distended. Extremity: no pedal edema PSYCH: alert & oriented x 3 with fluent speech NEURO: no focal motor/sensory deficits  LABORATORY DATA:  I have reviewed the data as listed  CBC Latest Ref Rng & Units 01/23/2021 01/03/2021  WBC 4.0 - 10.5 K/uL 5.6 8.5  Hemoglobin 13.0 - 17.0 g/dL 14.4 14.4  Hematocrit 39.0 - 52.0 % 42.0 41.3  Platelets 150 - 400 K/uL 298 303    CMP Latest Ref Rng & Units 01/03/2021  Glucose 70 - 99 mg/dL 108(H)  BUN 6 - 20 mg/dL 11  Creatinine 0.61 - 1.24 mg/dL 1.02  Sodium 135 - 145 mmol/L 140  Potassium 3.5 - 5.1 mmol/L 3.9  Chloride 98 - 111 mmol/L 103  CO2 22 - 32 mmol/L 27  Calcium 8.9 - 10.3 mg/dL 9.6  Total Protein 6.5 - 8.1 g/dL  8.2(H)  Total Bilirubin 0.3 - 1.2 mg/dL 1.0  Alkaline Phos 38 - 126 U/L 52  AST 15 - 41 U/L 21  ALT 0 - 44 U/L 15   01/23/2021  Surgical Pathology  -Monoclonal B cell population identified  -No aberrant T-cell phenotype identified  -See comment   COMMENT:  Flow cytometric analysis of the lymphoid population representing 19% of  all cells in the sample shows a monoclonal, kappa restricted B-cell  population representing 5% of all cells and expressing B-cell antigens  including CD20 with lack of CD5, CD10, CD25, CD38 or CD103 expression.  The pattern is nonspecific but consistent with involvement by a B-cell  lymphoproliferative process. Admixed T-cells do not show an aberrant  phenotype.  RADIOGRAPHIC STUDIES: I have personally reviewed the radiological images as listed and agreed with the findings in the report. CT Biopsy  Result Date: 01/23/2021 INDICATION: Waldenstrom's macroglobulinemia EXAM: CT GUIDED RIGHT ILIAC BONE MARROW ASPIRATION AND CORE BIOPSY Date:  01/23/2021 01/23/2021 11:23 am Radiologist:  M. Daryll Brod, MD Guidance:  CT FLUOROSCOPY TIME:  Fluoroscopy Time: None. MEDICATIONS: 1% lidocaine local ANESTHESIA/SEDATION: 1.0 mg IV Versed; 50 mcg IV Fentanyl Moderate Sedation Time:  10 minutes The patient was continuously monitored during the procedure by the interventional radiology nurse under my direct supervision. CONTRAST:  None. COMPLICATIONS: None PROCEDURE: Informed consent was obtained from the patient following explanation of the procedure, risks, benefits and alternatives. The patient understands, agrees and consents for the procedure. All questions were addressed. A time out was performed. The patient was positioned prone and non-contrast localization CT was performed of the pelvis to demonstrate the iliac marrow spaces. Maximal barrier sterile technique utilized including caps, mask, sterile gowns, sterile gloves, large sterile drape, hand hygiene, and Betadine prep.  Under sterile conditions and local anesthesia, an 11 gauge coaxial bone biopsy needle was advanced into the right iliac marrow space. Needle position was confirmed with CT imaging. Initially, bone marrow aspiration was performed. Next, the 11 gauge outer cannula was utilized to obtain a right iliac bone marrow core biopsy. Needle was removed. Hemostasis was obtained with compression. The patient tolerated the procedure well. Samples were prepared with the cytotechnologist. No immediate complications. IMPRESSION: CT guided right iliac bone marrow aspiration and core biopsy. Electronically Signed   By: Jerilynn Mages.  Shick M.D.   On: 01/23/2021 12:02   CT BONE MARROW BIOPSY & ASPIRATION  Result Date: 01/23/2021 INDICATION: Waldenstrom's macroglobulinemia EXAM: CT GUIDED RIGHT ILIAC BONE MARROW ASPIRATION AND CORE BIOPSY Date:  01/23/2021 01/23/2021 11:23 am Radiologist:  M. Daryll Brod, MD Guidance:  CT FLUOROSCOPY TIME:  Fluoroscopy Time: None. MEDICATIONS: 1% lidocaine local ANESTHESIA/SEDATION: 1.0 mg IV Versed; 50 mcg IV Fentanyl Moderate Sedation Time:  10 minutes The patient was continuously monitored during the procedure by the interventional radiology nurse under my direct supervision. CONTRAST:  None. COMPLICATIONS: None PROCEDURE: Informed consent was obtained from the patient following explanation of the procedure, risks, benefits and alternatives. The patient understands, agrees and consents for the procedure. All questions were addressed. A time out was performed. The patient was positioned prone and non-contrast localization CT was performed of the pelvis to demonstrate the iliac marrow spaces. Maximal barrier sterile technique utilized including caps, mask, sterile gowns, sterile gloves, large sterile drape, hand hygiene, and Betadine prep. Under sterile conditions and local anesthesia, an 11 gauge coaxial bone biopsy needle was advanced into the right iliac marrow space. Needle position was confirmed with CT  imaging. Initially, bone marrow aspiration was performed. Next, the 11 gauge outer cannula was utilized to obtain a right iliac bone marrow core biopsy. Needle was removed. Hemostasis was obtained with compression. The patient tolerated the procedure well. Samples were prepared  with the cytotechnologist. No immediate complications. IMPRESSION: CT guided right iliac bone marrow aspiration and core biopsy. Electronically Signed   By: Jerilynn Mages.  Shick M.D.   On: 01/23/2021 12:02   ECHOCARDIOGRAM COMPLETE  Result Date: 01/18/2021    ECHOCARDIOGRAM REPORT   Patient Name:   KENIEL RALSTON Date of Exam: 01/18/2021 Medical Rec #:  [id number]    Height:       70.0 in Accession #:    [id number]   Weight:       171.3 lb Date of Birth:  1965/11/24     BSA:          1.954 m Patient Age:    14 years     BP:           118/72 mmHg Patient Gender: M            HR:           63 bpm. Exam Location:  Outpatient Procedure: 2D Echo, Cardiac Doppler, Color Doppler and Strain Analysis Indications:    Cardiomegaly  History:        Patient has prior history of Echocardiogram examinations, most                 recent 06/27/2015. Cardiomegaly; Signs/Symptoms:Shortness of                 Breath and fatigue.  Sonographer:    Dustin Flock Referring Phys: [id number] [place]  1. Left ventricular ejection fraction, by estimation, is 50 to 55%. The left ventricle has low normal function. The left ventricle has no regional wall motion abnormalities. Left ventricular diastolic parameters were normal. The average left ventricular  global longitudinal strain is -23.9 %. The global longitudinal strain is normal.  2. Right ventricular systolic function is normal. The right ventricular size is normal. Tricuspid regurgitation signal is inadequate for assessing PA pressure.  3. The mitral valve is normal in structure. Trivial mitral valve regurgitation. No evidence of mitral stenosis.  4. The aortic valve is tricuspid. Aortic valve  regurgitation is not visualized. No aortic stenosis is present.  5. The inferior vena cava is normal in size with greater than 50% respiratory variability, suggesting right atrial pressure of 3 mmHg. FINDINGS  Left Ventricle: Left ventricular ejection fraction, by estimation, is 50 to 55%. The left ventricle has low normal function. The left ventricle has no regional wall motion abnormalities. The average left ventricular global longitudinal strain is -23.9 %. The global longitudinal strain is normal. The left ventricular internal cavity size was normal in size. There is no left ventricular hypertrophy. Left ventricular diastolic parameters were normal. Right Ventricle: The right ventricular size is normal. No increase in right ventricular wall thickness. Right ventricular systolic function is normal. Tricuspid regurgitation signal is inadequate for assessing PA pressure. Left Atrium: Left atrial size was normal in size. Right Atrium: Right atrial size was normal in size. Pericardium: There is no evidence of pericardial effusion. Mitral Valve: The mitral valve is normal in structure. Trivial mitral valve regurgitation. No evidence of mitral valve stenosis. Tricuspid Valve: The tricuspid valve is normal in structure. Tricuspid valve regurgitation is trivial. No evidence of tricuspid stenosis. Aortic Valve: The aortic valve is tricuspid. Aortic valve regurgitation is not visualized. No aortic stenosis is present. Pulmonic Valve: The pulmonic valve was normal in structure. Pulmonic valve regurgitation is trivial. No evidence of pulmonic stenosis. Aorta: The aortic root is normal in size and structure. Venous: The inferior vena cava  is normal in size with greater than 50% respiratory variability, suggesting right atrial pressure of 3 mmHg. IAS/Shunts: No atrial level shunt detected by color flow Doppler.  LEFT VENTRICLE PLAX 2D LVIDd:         5.60 cm  Diastology LVIDs:         3.50 cm  LV e' medial:    7.62 cm/s LV PW:          1.00 cm  LV E/e' medial:  7.3 LV IVS:        1.00 cm  LV e' lateral:   9.36 cm/s LVOT diam:     2.10 cm  LV E/e' lateral: 5.9 LV SV:         74 LV SV Index:   38       2D Longitudinal Strain LVOT Area:     3.46 cm 2D Strain GLS Avg:     -23.9 %  RIGHT VENTRICLE RV Basal diam:  2.40 cm RV S prime:     12.90 cm/s TAPSE (M-mode): 3.2 cm LEFT ATRIUM             Index       RIGHT ATRIUM           Index LA diam:        4.30 cm 2.20 cm/m  RA Area:     18.90 cm LA Vol (A2C):   44.8 ml 22.92 ml/m RA Volume:   56.10 ml  28.71 ml/m LA Vol (A4C):   32.0 ml 16.37 ml/m LA Biplane Vol: 39.4 ml 20.16 ml/m  AORTIC VALVE LVOT Vmax:   107.00 cm/s LVOT Vmean:  66.700 cm/s LVOT VTI:    0.215 m  AORTA Ao Root diam: 3.30 cm MITRAL VALVE MV Area (PHT): 2.11 cm    SHUNTS MV Decel Time: 359 msec    Systemic VTI:  0.22 m MV E velocity: 55.30 cm/s  Systemic Diam: 2.10 cm MV A velocity: 51.40 cm/s MV E/A ratio:  1.08 Cherlynn Kaiser MD Electronically signed by Cherlynn Kaiser MD Signature Date/Time: 01/18/2021/2:49:58 PM    Final     ASSESSMENT & PLAN:   55 yo with   1) 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 -Discussed pt CT Bone Marrow Biopsy & Aspiration ([id number]) on 01/23/2021; 10% of cells. At the edge of MGUS versus Waldenstrom's. Will add cytogenetic report on. -Advised pt that his serum viscosity was WNL and LDH was normal as well. Fibrinogen and prothrombin time normal. He had an m-protein of 1.8, IgM. -Advised pt that the PET scan has been denied by insurance and we will now have to get a CT scan to observe for enlarged lymph nodes, liver, or spleen. -Advised pt that Waldenstrom's is not very fast progressing even if it is this. Discussed objective findings of blood counts being affected,  enlarged lymph nodes.  -Discussed the fact that this is not curable but is treatable. Advised pt that standard treatment for Waldenstrom's is Bendamustine plus Rituxan. Discussed potential of treating with just Rituxan if on the edge and not doing the chemotherapy at the start. -Advised pt that if the burning sensation in arms and hands is new and progressive, we should evaluate this with a nerve conduction study. -Recommended pt de-stress, stay active, eat healthy, and get ample sleep. -Recommended pt drink 48-64 oz water  daily. -Start Baby ASA daily. -Will get CT scan in 1 week. -Will see back in 2 weeks via phone call to review CT results.    FOLLOW UP: Ct neck/chest/abd/pelvis in 1 week Phone visit in 2 weeks to discuss CT results   All of the patients questions were answered with apparent satisfaction. The patient knows to call the clinic with any problems, questions or concerns.  The total time spent in the appointment was 40 minutes and more than 50% was on counseling and direct patient cares.   Sullivan Lone MD [place] AAHIVMS [facility] Hematology/Oncology Physician [facility]  (Office):       [phone] (Work cell):  [phone] (Fax):           [phone]  01/29/2021 4:55 PM  I, Reinaldo Raddle, am acting as scribe for Dr. Sullivan Lone, MD.   .I have reviewed the above documentation for accuracy and completeness, and I agree with the above. Brunetta Genera MD

## 2021-01-29 ENCOUNTER — Inpatient Hospital Stay: Payer: No Typology Code available for payment source | Attending: Hematology | Admitting: Hematology

## 2021-01-29 ENCOUNTER — Other Ambulatory Visit: Payer: Self-pay

## 2021-01-29 VITALS — BP 111/68 | HR 58 | Temp 97.8°F | Resp 17 | Ht 70.0 in | Wt 170.7 lb

## 2021-01-29 DIAGNOSIS — C880 Waldenstrom macroglobulinemia: Secondary | ICD-10-CM | POA: Diagnosis not present

## 2021-01-29 DIAGNOSIS — D472 Monoclonal gammopathy: Secondary | ICD-10-CM | POA: Insufficient documentation

## 2021-01-29 DIAGNOSIS — Z79899 Other long term (current) drug therapy: Secondary | ICD-10-CM | POA: Insufficient documentation

## 2021-01-29 DIAGNOSIS — G479 Sleep disorder, unspecified: Secondary | ICD-10-CM | POA: Insufficient documentation

## 2021-01-29 DIAGNOSIS — Z800 Family history of malignant neoplasm of digestive organs: Secondary | ICD-10-CM | POA: Diagnosis not present

## 2021-01-29 DIAGNOSIS — R208 Other disturbances of skin sensation: Secondary | ICD-10-CM | POA: Insufficient documentation

## 2021-01-29 DIAGNOSIS — R5383 Other fatigue: Secondary | ICD-10-CM | POA: Insufficient documentation

## 2021-01-29 DIAGNOSIS — R140 Abdominal distension (gaseous): Secondary | ICD-10-CM | POA: Diagnosis not present

## 2021-01-29 DIAGNOSIS — R0602 Shortness of breath: Secondary | ICD-10-CM | POA: Diagnosis not present

## 2021-01-29 DIAGNOSIS — E039 Hypothyroidism, unspecified: Secondary | ICD-10-CM | POA: Diagnosis not present

## 2021-01-29 DIAGNOSIS — K219 Gastro-esophageal reflux disease without esophagitis: Secondary | ICD-10-CM | POA: Diagnosis not present

## 2021-01-29 DIAGNOSIS — Z8349 Family history of other endocrine, nutritional and metabolic diseases: Secondary | ICD-10-CM | POA: Insufficient documentation

## 2021-01-30 ENCOUNTER — Encounter: Payer: Self-pay | Admitting: Hematology

## 2021-01-30 ENCOUNTER — Encounter: Payer: Self-pay | Admitting: *Deleted

## 2021-01-30 ENCOUNTER — Ambulatory Visit: Payer: No Typology Code available for payment source | Admitting: Hematology

## 2021-02-01 ENCOUNTER — Encounter: Payer: Self-pay | Admitting: Hematology

## 2021-02-01 ENCOUNTER — Encounter (HOSPITAL_COMMUNITY): Payer: Self-pay | Admitting: Hematology

## 2021-02-07 ENCOUNTER — Other Ambulatory Visit: Payer: Self-pay

## 2021-02-07 ENCOUNTER — Ambulatory Visit (HOSPITAL_COMMUNITY)
Admission: RE | Admit: 2021-02-07 | Discharge: 2021-02-07 | Disposition: A | Discharge status: Home / Self Care | Payer: No Typology Code available for payment source | Source: Ambulatory Visit | Attending: Hematology | Admitting: Hematology

## 2021-02-07 ENCOUNTER — Encounter (HOSPITAL_COMMUNITY): Payer: Self-pay

## 2021-02-07 DIAGNOSIS — C880 Waldenstrom macroglobulinemia: Secondary | ICD-10-CM | POA: Diagnosis not present

## 2021-02-07 MED ORDER — IOHEXOL 300 MG/ML  SOLN
100.0000 mL | Freq: Once | INTRAMUSCULAR | Status: AC | PRN
  Administered 2021-02-07: 100 mL via INTRAVENOUS

## 2021-02-07 MED ORDER — SODIUM CHLORIDE (PF) 0.9 % IJ SOLN
INTRAMUSCULAR | Status: AC
  Filled 2021-02-07: qty 50

## 2021-02-13 NOTE — Progress Notes (Signed)
HEMATOLOGY/ONCOLOGY CLINIC NOTE  Date of Service: 02/14/2021  Patient Care Team: [name], NP as PCP - General (Nurse Practitioner)  CHIEF COMPLAINTS/PURPOSE OF CONSULTATION:  Abnormal M-Spike and IgM levels  HISTORY OF PRESENTING ILLNESS:   Nathaniel Oliver is a wonderful 55 y.o. male who has been referred to [place] by Georgette Shell, PA at [facility] for evaluation and management of abnormal serum protein. The pt reports that he is doing well overall. We are joined today by his wife.  The pt reports that he has a history of hypothyroidism. The pt notes that he had Shingles in July 2021 and everything was normal prior to this. The pt notes he had visited the doctor one week prior and felt great. This was fairly bad on his back and was not triggered by any steroid use. The pt notes he had recently had more work stress and personal stress with addition of a puppy. The pt notes he recovered in 4 weeks. The pt notes that again in November he experienced a cold for two days. The pt then notes that he felt a bronchial spasm with fluttering in his heart in December, helped with antibiotics. The pt notes he never used to be sick prior. The pt notes that his urine has also been brown and foamy persistent since around the same time as Shingles. The pt notes this is worse in the morning and night. He notes this is different than what he was used to, but not overly foamy. The pt notes that since these recent finding, he has not slept well and has been very anxious regarding the results and potential diagnosis. The pt also notes that he had a past medical issue with lack of recovery in 2016 and had to stop his competitive cycling. He would ride for an hour-1.5 hours then get extremely fatigued when he would push himself. This did not affect his regular day life. The pt notes that on blood work he had EBV antibody elevated. They were checking for chronic fatigue syndrome in doing all of these lab  tests. He notes they could not explain his extreme fatigue and change from baseline. The pt notes that since then he has been on many vitamins and supplements that have been helping him. The pt notes that currently he lifts weight and is feeling fine. The pt notes bronchial allergies as a kid, but no asthma. He denies needing inhalers for this. The pt has been having hypothyroidism since 2010, but is unknown of what the cause is. The pt denies any medication allergies or surgeries in the past. The pt notes he has been on testosterone since 2016 when he was experiencing fatigue. He is using .5cc of the intramuscular injections every 4-5 days. The pt notes that his integrative doctor is monitoring this and his levels are now in the 700s. The pt notes his father passed away from pancreatic cancer in his mid 55s, mother had hypertension and high cholesterol and history of familial stroke. The pt notes his father smoked over a pack a day for an extended period of time. The pt has had the [facility] testing that checks for mutations and found nothing detected.   The pt denies any history of smoking and notes he would occasionally drink beers on weekends. He notes that in his adult life he has been very healthy and proactive. The pt notes that the fluttering is improved, but occurs when he is around his dogs. The pt has  three big dogs that are downstairs dogs. The pt notes that he has had GERD since his mid 72s and gets bloated after eating. 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  Lab results 12/21/2020 showed an m-spike of 1.8 and IgM of 2,397. 12/21/2020 CMP WNL. 11/20/2020 CBC WNL.  On review of systems, pt reports bloating, difficulty sleeping and denies fatigue, fevers, chills, night sweats, sudden weight loss, new lumps/bumps, skin rashes, persistent pain in area of shingles, change in vision, acute headaches, floaters, sudden change in vision, acute SOB, chest pain, leg swelling,  abdominal pain, back pain, acute bone pains, testicular pain/swelling, and any other symptoms.  INTERVAL HISTORY  I connected with Nathaniel Oliver on 02/14/2021 by telephone and verified that I am speaking with the correct person using two identifiers.   I discussed the limitations of evaluation and management by telemedicine. The patient expressed understanding and agreed to proceed.   Other persons participating in the visit and their role in the encounter:                                                         - Reinaldo Raddle, Medical Scribe    Patient's location: Home Provider's location: [facility] [place] is a wonderful 55 y.o. male who is here today for f/u regarding evaluation and management of abnormal serum protein. The patient's last visit with [place] was on 01/29/2021. The pt reports that he is doing well overall.  The pt reports no new symptoms or concerns since the last visit. The pt notes minimal symptoms when he sleeps well. The pt notes intermittent issues with waking up and staying up during the night. The pt notes that he remembers the burning sensation he was describing was similar to when he had Shingles. The pt took some Valtrex he had at home and the burning has since subsided.   Of note since the patient's last visit, pt has had CT C/A/P ([id number]) on 02/07/2021, which revealed  "1. No acute findings within the chest, abdomen, or pelvis. No Lymphadenopathy."  On review of systems, pt reports intermittent difficulty sleeping and denies worsening neuropathy, burning sensation in hands,  and any other symptoms.  MEDICAL HISTORY:  Past Medical History:  Diagnosis Date  . [place] pain ([place]   . Chest pain   . Dyspnea   . Fatigue   . GERD (gastroesophageal reflux disease)   . Hypothyroidism   . SOB (shortness of breath)     SURGICAL HISTORY: Past Surgical History:  Procedure Laterality Date  . NO PAST SURGERIES      SOCIAL HISTORY: Social History    Socioeconomic History  . Marital status: Married    Spouse name: Not on file  . Number of children: Not on file  . Years of education: Not on file  . Highest education level: Not on file  Occupational History  . Not on file  Tobacco Use  . Smoking status: Never Smoker  . Smokeless tobacco: Never Used  Vaping Use  . Vaping Use: Never used  Substance and Sexual Activity  . Alcohol use: Yes    Alcohol/week: 3.0 standard drinks    Types: 3 Cans of beer per week  . Drug use: Never  . Sexual activity: Not on file  Other Topics Concern  . Not on file  Social History Narrative  . Not on file   Social Determinants of Health   Financial Resource Strain: Not on file  Food Insecurity: Not on file  Transportation Needs: Not on file  Physical Activity: Not on file  Stress: Not on file  Social Connections: Not on file  Intimate Partner Violence: Not on file    FAMILY HISTORY: Family History  Problem Relation Age of Onset  . Hypertension Mother   . Stroke Mother   . Hyperlipidemia Mother   . Pancreatic cancer Father   . Healthy Sister   . Healthy Brother     ALLERGIES:  is allergic to fish allergy.  MEDICATIONS:  Current Outpatient Medications  Medication Sig Dispense Refill  . diphenhydrAMINE (BENADRYL) 25 MG tablet Take 25 mg by mouth every 6 (six) hours as needed.    Marland Kitchen EPINEPHrine 0.3 mg/0.3 mL IJ SOAJ injection Inject 1 Device into the muscle once as needed. Inject into thigh as need for allergic reaction.Seek medical attention after using. (Patient not taking: Reported on 01/03/2021)    . levothyroxine (SYNTHROID, LEVOTHROID) 25 MCG tablet Take 25 mcg by mouth daily. (Patient not taking: Reported on 01/23/2021)    . LORazepam (ATIVAN) 0.5 MG tablet Take 1 tablet (0.5 mg total) by mouth every 8 (eight) hours. 30 tablet 0  . Multiple Vitamin (MULTIVITAMIN) tablet Take 1 tablet by mouth daily.    Marland Kitchen testosterone cypionate (DEPOTESTOSTERONE CYPIONATE) 200 MG/ML injection  SMARTSIG:0.3 Milliliter(s) IM Twice a Week    . thyroid (ARMOUR) 30 MG tablet Take by mouth.     No current facility-administered medications for this visit.    REVIEW OF SYSTEMS:   10 Point review of Systems was done is negative except as noted above.  PHYSICAL EXAMINATION: ECOG PERFORMANCE STATUS: 1 - Symptomatic but completely ambulatory  There were no vitals filed for this visit. There were no vitals filed for this visit. .There is no height or weight on file to calculate BMI.  Telehealth Visit.   LABORATORY DATA:  I have reviewed the data as listed  CBC Latest Ref Rng & Units 01/23/2021 01/03/2021  WBC 4.0 - 10.5 K/uL 5.6 8.5  Hemoglobin 13.0 - 17.0 g/dL 14.4 14.4  Hematocrit 39.0 - 52.0 % 42.0 41.3  Platelets 150 - 400 K/uL 298 303    CMP Latest Ref Rng & Units 01/03/2021  Glucose 70 - 99 mg/dL 108(H)  BUN 6 - 20 mg/dL 11  Creatinine 0.61 - 1.24 mg/dL 1.02  Sodium 135 - 145 mmol/L 140  Potassium 3.5 - 5.1 mmol/L 3.9  Chloride 98 - 111 mmol/L 103  CO2 22 - 32 mmol/L 27  Calcium 8.9 - 10.3 mg/dL 9.6  Total Protein 6.5 - 8.1 g/dL 8.2(H)  Total Bilirubin 0.3 - 1.2 mg/dL 1.0  Alkaline Phos 38 - 126 U/L 52  AST 15 - 41 U/L 21  ALT 0 - 44 U/L 15   01/23/2021 Surgical Pathology  -Monoclonal B cell population identified  -No aberrant T-cell phenotype identified  -See comment   COMMENT:  Flow cytometric analysis of the lymphoid population representing 19% of  all cells in the sample shows a monoclonal, kappa restricted B-cell  population representing 5% of all cells and expressing B-cell antigens  including CD20 with lack of CD5, CD10, CD25, CD38 or CD103 expression.  The pattern is nonspecific but consistent with involvement by a B-cell  lymphoproliferative process. Admixed T-cells do not show an aberrant  phenotype.  RADIOGRAPHIC STUDIES: I have personally reviewed the radiological images as listed and agreed with the findings in the report. CT CHEST ABDOMEN  PELVIS W CONTRAST  Result Date: 02/08/2021 CLINICAL DATA:  Hematologic malignancy, initial staging for newly diagnosed wall and strong is macroglobulinemia. EXAM: CT CHEST, ABDOMEN, AND PELVIS WITH CONTRAST TECHNIQUE: Multidetector CT imaging of the chest, abdomen and pelvis was performed following the standard protocol during bolus administration of intravenous contrast. CONTRAST:  130mL OMNIPAQUE IOHEXOL 300 MG/ML  SOLN COMPARISON:  Abdominal ultrasound May 29, 2018 age FINDINGS: CT CHEST FINDINGS Cardiovascular: No significant vascular findings. Normal heart size. No pericardial effusion. Mediastinum/Nodes: No enlarged mediastinal, hilar, or axillary lymph nodes. Thyroid gland, trachea, and esophagus demonstrate no significant findings. Lungs/Pleura: Mild biapical pleuroparenchymal scarring. No suspicious pulmonary nodules or masses. No focal consolidation. No pleural effusion. No pneumothorax. Musculoskeletal: No chest wall mass or suspicious bone lesions identified. CT ABDOMEN PELVIS FINDINGS Hepatobiliary: No suspicious hepatic lesion. Gallbladder is unremarkable. No biliary ductal dilation. Pancreas: Unremarkable. No pancreatic ductal dilatation or surrounding inflammatory changes. Spleen: Normal in size without focal abnormality. Adrenals/Urinary Tract: Adrenal glands are unremarkable. Kidneys are normal, without renal calculi, focal lesion, or hydronephrosis. Bladder is unremarkable. Stomach/Bowel: Ingested enteric contrast visualized to the distal small bowel. Stomach is grossly unremarkable. Normal positioning of the duodenum/ligament of Treitz. No suspicious small bowel wall thickening or dilation. The appendix is unremarkable. No suspicious colonic wall thickening or mass like lesions visualized. Vascular/Lymphatic: No significant vascular findings are present. No gastrohepatic or hepatoduodenal ligament lymphadenopathy. No retroperitoneal or mesenteric lymphadenopathy. No pelvic sidewall  lymphadenopathy. No groin lymphadenopathy. Reproductive: Prostate is unremarkable. Other: No abdominal wall hernia or abnormality. No abdominopelvic ascites. Musculoskeletal: Multilevel degenerative changes spine. Degenerative changes bilateral hips. No aggressive lytic or blastic lesion of bone. IMPRESSION: 1. No acute findings within the chest, abdomen, or pelvis. No lymphadenopathy. Electronically Signed   By: Dahlia Bailiff MD   On: 02/08/2021 14:53   CT Biopsy  Result Date: 01/23/2021 INDICATION: Waldenstrom's macroglobulinemia EXAM: CT GUIDED RIGHT ILIAC BONE MARROW ASPIRATION AND CORE BIOPSY Date:  01/23/2021 01/23/2021 11:23 am Radiologist:  M. Daryll Brod, MD Guidance:  CT FLUOROSCOPY TIME:  Fluoroscopy Time: None. MEDICATIONS: 1% lidocaine local ANESTHESIA/SEDATION: 1.0 mg IV Versed; 50 mcg IV Fentanyl Moderate Sedation Time:  10 minutes The patient was continuously monitored during the procedure by the interventional radiology nurse under my direct supervision. CONTRAST:  None. COMPLICATIONS: None PROCEDURE: Informed consent was obtained from the patient following explanation of the procedure, risks, benefits and alternatives. The patient understands, agrees and consents for the procedure. All questions were addressed. A time out was performed. The patient was positioned prone and non-contrast localization CT was performed of the pelvis to demonstrate the iliac marrow spaces. Maximal barrier sterile technique utilized including caps, mask, sterile gowns, sterile gloves, large sterile drape, hand hygiene, and Betadine prep. Under sterile conditions and local anesthesia, an 11 gauge coaxial bone biopsy needle was advanced into the right iliac marrow space. Needle position was confirmed with CT imaging. Initially, bone marrow aspiration was performed. Next, the 11 gauge outer cannula was utilized to obtain a right iliac bone marrow core biopsy. Needle was removed. Hemostasis was obtained with compression.  The patient tolerated the procedure well. Samples were prepared with the cytotechnologist. No immediate complications. IMPRESSION: CT guided right iliac bone marrow aspiration and core biopsy. Electronically Signed   By: Jerilynn Mages.  Shick M.D.   On: 01/23/2021 12:02   CT BONE MARROW BIOPSY &  ASPIRATION  Result Date: 01/23/2021 INDICATION: Waldenstrom's macroglobulinemia EXAM: CT GUIDED RIGHT ILIAC BONE MARROW ASPIRATION AND CORE BIOPSY Date:  01/23/2021 01/23/2021 11:23 am Radiologist:  M. Daryll Brod, MD Guidance:  CT FLUOROSCOPY TIME:  Fluoroscopy Time: None. MEDICATIONS: 1% lidocaine local ANESTHESIA/SEDATION: 1.0 mg IV Versed; 50 mcg IV Fentanyl Moderate Sedation Time:  10 minutes The patient was continuously monitored during the procedure by the interventional radiology nurse under my direct supervision. CONTRAST:  None. COMPLICATIONS: None PROCEDURE: Informed consent was obtained from the patient following explanation of the procedure, risks, benefits and alternatives. The patient understands, agrees and consents for the procedure. All questions were addressed. A time out was performed. The patient was positioned prone and non-contrast localization CT was performed of the pelvis to demonstrate the iliac marrow spaces. Maximal barrier sterile technique utilized including caps, mask, sterile gowns, sterile gloves, large sterile drape, hand hygiene, and Betadine prep. Under sterile conditions and local anesthesia, an 11 gauge coaxial bone biopsy needle was advanced into the right iliac marrow space. Needle position was confirmed with CT imaging. Initially, bone marrow aspiration was performed. Next, the 11 gauge outer cannula was utilized to obtain a right iliac bone marrow core biopsy. Needle was removed. Hemostasis was obtained with compression. The patient tolerated the procedure well. Samples were prepared with the cytotechnologist. No immediate complications. IMPRESSION: CT guided right iliac bone marrow  aspiration and core biopsy. Electronically Signed   By: Jerilynn Mages.  Shick M.D.   On: 01/23/2021 12:02   ECHOCARDIOGRAM COMPLETE  Result Date: 01/18/2021    ECHOCARDIOGRAM REPORT   Patient Name:   EDRIC FETTERMAN Date of Exam: 01/18/2021 Medical Rec #:  [id number]    Height:       70.0 in Accession #:    [id number]   Weight:       171.3 lb Date of Birth:  Mar 27, 1966     BSA:          1.954 m Patient Age:    23 years     BP:           118/72 mmHg Patient Gender: M            HR:           63 bpm. Exam Location:  Outpatient Procedure: 2D Echo, Cardiac Doppler, Color Doppler and Strain Analysis Indications:    Cardiomegaly  History:        Patient has prior history of Echocardiogram examinations, most                 recent 06/27/2015. Cardiomegaly; Signs/Symptoms:Shortness of                 Breath and fatigue.  Sonographer:    Dustin Flock Referring Phys: [id number] [place]  1. Left ventricular ejection fraction, by estimation, is 50 to 55%. The left ventricle has low normal function. The left ventricle has no regional wall motion abnormalities. Left ventricular diastolic parameters were normal. The average left ventricular  global longitudinal strain is -23.9 %. The global longitudinal strain is normal.  2. Right ventricular systolic function is normal. The right ventricular size is normal. Tricuspid regurgitation signal is inadequate for assessing PA pressure.  3. The mitral valve is normal in structure. Trivial mitral valve regurgitation. No evidence of mitral stenosis.  4. The aortic valve is tricuspid. Aortic valve regurgitation is not visualized. No aortic stenosis is present.  5. The inferior vena cava is normal in size with greater than  50% respiratory variability, suggesting right atrial pressure of 3 mmHg. FINDINGS  Left Ventricle: Left ventricular ejection fraction, by estimation, is 50 to 55%. The left ventricle has low normal function. The left ventricle has no regional wall motion  abnormalities. The average left ventricular global longitudinal strain is -23.9 %. The global longitudinal strain is normal. The left ventricular internal cavity size was normal in size. There is no left ventricular hypertrophy. Left ventricular diastolic parameters were normal. Right Ventricle: The right ventricular size is normal. No increase in right ventricular wall thickness. Right ventricular systolic function is normal. Tricuspid regurgitation signal is inadequate for assessing PA pressure. Left Atrium: Left atrial size was normal in size. Right Atrium: Right atrial size was normal in size. Pericardium: There is no evidence of pericardial effusion. Mitral Valve: The mitral valve is normal in structure. Trivial mitral valve regurgitation. No evidence of mitral valve stenosis. Tricuspid Valve: The tricuspid valve is normal in structure. Tricuspid valve regurgitation is trivial. No evidence of tricuspid stenosis. Aortic Valve: The aortic valve is tricuspid. Aortic valve regurgitation is not visualized. No aortic stenosis is present. Pulmonic Valve: The pulmonic valve was normal in structure. Pulmonic valve regurgitation is trivial. No evidence of pulmonic stenosis. Aorta: The aortic root is normal in size and structure. Venous: The inferior vena cava is normal in size with greater than 50% respiratory variability, suggesting right atrial pressure of 3 mmHg. IAS/Shunts: No atrial level shunt detected by color flow Doppler.  LEFT VENTRICLE PLAX 2D LVIDd:         5.60 cm  Diastology LVIDs:         3.50 cm  LV e' medial:    7.62 cm/s LV PW:         1.00 cm  LV E/e' medial:  7.3 LV IVS:        1.00 cm  LV e' lateral:   9.36 cm/s LVOT diam:     2.10 cm  LV E/e' lateral: 5.9 LV SV:         74 LV SV Index:   38       2D Longitudinal Strain LVOT Area:     3.46 cm 2D Strain GLS Avg:     -23.9 %  RIGHT VENTRICLE RV Basal diam:  2.40 cm RV S prime:     12.90 cm/s TAPSE (M-mode): 3.2 cm LEFT ATRIUM             Index        RIGHT ATRIUM           Index LA diam:        4.30 cm 2.20 cm/m  RA Area:     18.90 cm LA Vol (A2C):   44.8 ml 22.92 ml/m RA Volume:   56.10 ml  28.71 ml/m LA Vol (A4C):   32.0 ml 16.37 ml/m LA Biplane Vol: 39.4 ml 20.16 ml/m  AORTIC VALVE LVOT Vmax:   107.00 cm/s LVOT Vmean:  66.700 cm/s LVOT VTI:    0.215 m  AORTA Ao Root diam: 3.30 cm MITRAL VALVE MV Area (PHT): 2.11 cm    SHUNTS MV Decel Time: 359 msec    Systemic VTI:  0.22 m MV E velocity: 55.30 cm/s  Systemic Diam: 2.10 cm MV A velocity: 51.40 cm/s MV E/A ratio:  1.08 Weston Brass MD Electronically signed by Weston Brass MD Signature Date/Time: 01/18/2021/2:49:58 PM    Final     ASSESSMENT & PLAN:   55 yo with  1) 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 -Advised pt we are still waiting on molecular pathology MYD88 results, but this finding by itself would not initiate treatment. This would be useful to know and confirm diagnosis. -Advised pt that this is a rather slow growing process and we just monitor it with close observation. The most common case will be a gradual increase in the m-protein. 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 The behavior over time will tell us the exact diagnosis between this and IgM MGUS. -In response to the patient's question be discussed indications for plasmapheresis. Advised pt this does not treat the underlying disease and is not indicated at this time. -Recommended pt de-stress, stay active, eat healthy, and get ample sleep. -Recommended pt drink 48-64 oz water daily. -Recommended pt start 2000 IU Vitamin D daily. Goal Vitamin D 60<>90. -Continue Baby ASA daily. -Will see back in 3 months with  labs.    FOLLOW UP: RTC w Dr Irene Limbo in 3 months. Labs 1 week prior.   All of the patients questions were answered with apparent satisfaction. The patient knows to call the clinic with any problems, questions or concerns.  The total time spent in the appointment was 20 minutes and more than 50% was on counseling and direct patient cares.   Sullivan Lone MD [place] AAHIVMS [facility] Hematology/Oncology Physician [facility]  (Office):       [phone] (Work cell):  [phone] (Fax):           [phone]  02/14/2021 4:40 PM  I, Reinaldo Raddle, am acting as scribe for Dr. Sullivan Lone, MD.  .I have reviewed the above documentation for accuracy and completeness, and I agree with the above. Brunetta Genera MD

## 2021-02-14 ENCOUNTER — Inpatient Hospital Stay: Payer: No Typology Code available for payment source | Attending: Hematology | Admitting: Hematology

## 2021-02-14 DIAGNOSIS — C880 Waldenstrom macroglobulinemia: Secondary | ICD-10-CM

## 2021-02-22 ENCOUNTER — Encounter (HOSPITAL_COMMUNITY): Payer: Self-pay | Admitting: Hematology

## 2021-03-08 LAB — SURGICAL PATHOLOGY

## 2021-05-09 ENCOUNTER — Other Ambulatory Visit: Payer: Self-pay

## 2021-05-09 ENCOUNTER — Inpatient Hospital Stay: Payer: No Typology Code available for payment source | Attending: Hematology

## 2021-05-09 DIAGNOSIS — Z808 Family history of malignant neoplasm of other organs or systems: Secondary | ICD-10-CM | POA: Diagnosis not present

## 2021-05-09 DIAGNOSIS — C880 Waldenstrom macroglobulinemia: Secondary | ICD-10-CM

## 2021-05-09 DIAGNOSIS — D472 Monoclonal gammopathy: Secondary | ICD-10-CM | POA: Insufficient documentation

## 2021-05-09 LAB — CBC WITH DIFFERENTIAL (CANCER CENTER ONLY)
Abs Immature Granulocytes: 0.01 10*3/uL (ref 0.00–0.07)
Basophils Absolute: 0.1 10*3/uL (ref 0.0–0.1)
Basophils Relative: 1 %
Eosinophils Absolute: 0.3 10*3/uL (ref 0.0–0.5)
Eosinophils Relative: 4 %
HCT: 38.4 % — ABNORMAL LOW (ref 39.0–52.0)
Hemoglobin: 13.2 g/dL (ref 13.0–17.0)
Immature Granulocytes: 0 %
Lymphocytes Relative: 24 %
Lymphs Abs: 1.6 10*3/uL (ref 0.7–4.0)
MCH: 32.4 pg (ref 26.0–34.0)
MCHC: 34.4 g/dL (ref 30.0–36.0)
MCV: 94.3 fL (ref 80.0–100.0)
Monocytes Absolute: 0.6 10*3/uL (ref 0.1–1.0)
Monocytes Relative: 9 %
Neutro Abs: 4.2 10*3/uL (ref 1.7–7.7)
Neutrophils Relative %: 62 %
Platelet Count: 274 10*3/uL (ref 150–400)
RBC: 4.07 MIL/uL — ABNORMAL LOW (ref 4.22–5.81)
RDW: 12.3 % (ref 11.5–15.5)
WBC Count: 6.8 10*3/uL (ref 4.0–10.5)
nRBC: 0 % (ref 0.0–0.2)

## 2021-05-09 LAB — CMP (CANCER CENTER ONLY)
ALT: 11 U/L (ref 0–44)
AST: 19 U/L (ref 15–41)
Albumin: 4 g/dL (ref 3.5–5.0)
Alkaline Phosphatase: 49 U/L (ref 38–126)
Anion gap: 9 (ref 5–15)
BUN: 11 mg/dL (ref 6–20)
CO2: 27 mmol/L (ref 22–32)
Calcium: 10.1 mg/dL (ref 8.9–10.3)
Chloride: 104 mmol/L (ref 98–111)
Creatinine: 1.06 mg/dL (ref 0.61–1.24)
GFR, Estimated: >60 mL/min (ref >60)
Glucose, Bld: 97 mg/dL (ref 70–99)
Potassium: 4.2 mmol/L (ref 3.5–5.1)
Sodium: 140 mmol/L (ref 135–145)
Total Bilirubin: 0.7 mg/dL (ref 0.3–1.2)
Total Protein: 7.7 g/dL (ref 6.5–8.1)

## 2021-05-09 LAB — LACTATE DEHYDROGENASE: LDH: 125 U/L (ref 98–192)

## 2021-05-10 LAB — KAPPA/LAMBDA LIGHT CHAINS
Kappa free light chain: 21.1 mg/L — ABNORMAL HIGH (ref 3.3–19.4)
Kappa, lambda light chain ratio: 2.54 — ABNORMAL HIGH (ref 0.26–1.65)
Lambda free light chains: 8.3 mg/L (ref 5.7–26.3)

## 2021-05-15 ENCOUNTER — Telehealth: Payer: Self-pay | Admitting: Hematology

## 2021-05-15 NOTE — Telephone Encounter (Signed)
Rescheduled upcoming appointment due to provider's emergency. Patient opted to reschedule appointment to first available instead of meeting with PA.

## 2021-05-16 ENCOUNTER — Inpatient Hospital Stay: Payer: No Typology Code available for payment source | Admitting: Hematology

## 2021-05-31 ENCOUNTER — Inpatient Hospital Stay: Payer: No Typology Code available for payment source

## 2021-05-31 ENCOUNTER — Inpatient Hospital Stay (HOSPITAL_BASED_OUTPATIENT_CLINIC_OR_DEPARTMENT_OTHER): Payer: No Typology Code available for payment source | Admitting: Hematology

## 2021-05-31 ENCOUNTER — Other Ambulatory Visit: Payer: Self-pay

## 2021-05-31 VITALS — BP 107/66 | HR 51 | Temp 97.6°F | Resp 20 | Wt 166.9 lb

## 2021-05-31 DIAGNOSIS — D472 Monoclonal gammopathy: Secondary | ICD-10-CM | POA: Diagnosis not present

## 2021-05-31 DIAGNOSIS — C880 Waldenstrom macroglobulinemia: Secondary | ICD-10-CM

## 2021-06-05 LAB — MULTIPLE MYELOMA PANEL, SERUM
Albumin SerPl Elph-Mcnc: 3.8 g/dL (ref 2.9–4.4)
Albumin/Glob SerPl: 1.1 (ref 0.7–1.7)
Alpha 1: 0.2 g/dL (ref 0.0–0.4)
Alpha2 Glob SerPl Elph-Mcnc: 0.5 g/dL (ref 0.4–1.0)
B-Globulin SerPl Elph-Mcnc: 0.8 g/dL (ref 0.7–1.3)
Gamma Glob SerPl Elph-Mcnc: 2.3 g/dL — ABNORMAL HIGH (ref 0.4–1.8)
Globulin, Total: 3.7 g/dL (ref 2.2–3.9)
IgA: 137 mg/dL (ref 90–386)
IgG (Immunoglobin G), Serum: 796 mg/dL (ref 603–1613)
IgM (Immunoglobulin M), Srm: 2356 mg/dL — ABNORMAL HIGH (ref 20–172)
M Protein SerPl Elph-Mcnc: 1.8 g/dL — ABNORMAL HIGH
Total Protein ELP: 7.5 g/dL (ref 6.0–8.5)

## 2021-06-07 NOTE — Progress Notes (Signed)
HEMATOLOGY/ONCOLOGY CLINIC NOTE  Date of Service: .05/31/2021   Patient Care Team: [name], NP as PCP - General (Nurse Practitioner)  CHIEF COMPLAINTS/PURPOSE OF CONSULTATION:  Abnormal M-Spike and IgM levels  HISTORY OF PRESENTING ILLNESS:   Nathaniel Oliver is a wonderful 55 y.o. male who has been referred to [place] by Roe Coombs, [place] at [facility] for evaluation and management of abnormal serum protein. The pt reports that he is doing well overall. We are joined today by his wife.  The pt reports that he has a history of hypothyroidism. The pt notes that he had Shingles in July 2021 and everything was normal prior to this. The pt notes he had visited the doctor one week prior and felt great. This was fairly bad on his back and was not triggered by any steroid use. The pt notes he had recently had more work stress and personal stress with addition of a puppy. The pt notes he recovered in 4 weeks. The pt notes that again in November he experienced a cold for two days. The pt then notes that he felt a bronchial spasm with fluttering in his heart in December, helped with antibiotics. The pt notes he never used to be sick prior. The pt notes that his urine has also been brown and foamy persistent since around the same time as Shingles. The pt notes this is worse in the morning and night. He notes this is different than what he was used to, but not overly foamy. The pt notes that since these recent finding, he has not slept well and has been very anxious regarding the results and potential diagnosis. The pt also notes that he had a past medical issue with lack of recovery in 2016 and had to stop his competitive cycling. He would ride for an hour-1.5 hours then get extremely fatigued when he would push himself. This did not affect his regular day life. The pt notes that on blood work he had EBV antibody elevated. They were checking for chronic fatigue syndrome in doing all of these  lab tests. He notes they could not explain his extreme fatigue and change from baseline. The pt notes that since then he has been on many vitamins and supplements that have been helping him. The pt notes that currently he lifts weight and is feeling fine. The pt notes bronchial allergies as a kid, but no asthma. He denies needing inhalers for this. The pt has been having hypothyroidism since 2010, but is unknown of what the cause is. The pt denies any medication allergies or surgeries in the past. The pt notes he has been on testosterone since 2016 when he was experiencing fatigue. He is using .5cc of the intramuscular injections every 4-5 days. The pt notes that his integrative doctor is monitoring this and his levels are now in the 700s. The pt notes his father passed away from pancreatic cancer in his mid 47s, mother had hypertension and high cholesterol and history of familial stroke. The pt notes his father smoked over a pack a day for an extended period of time. The pt has had the [facility] testing that checks for mutations and found nothing detected.   The pt denies any history of smoking and notes he would occasionally drink beers on weekends. He notes that in his adult life he has been very healthy and proactive. The pt notes that the fluttering is improved, but occurs when he is around his dogs. The pt  has three big dogs that are downstairs dogs. The pt notes that he has had GERD since his mid 53s and gets bloated after eating. 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  Lab results 12/21/2020 showed an m-spike of 1.8 and IgM of 2,397. 12/21/2020 CMP WNL. 11/20/2020 CBC WNL.  On review of systems, pt reports bloating, difficulty sleeping and denies fatigue, fevers, chills, night sweats, sudden weight loss, new lumps/bumps, skin rashes, persistent pain in area of shingles, change in vision, acute headaches, floaters, sudden change in vision, acute SOB, chest pain, leg swelling,  abdominal pain, back pain, acute bone pains, testicular pain/swelling, and any other symptoms.  INTERVAL HISTORY  Nathaniel Oliver is here today for f/u regarding evaluation and management of his indolent Walden Strom's macroglobulinemia . the patient's last visit with [place] was on 02/14/2021. The pt reports that he is doing well overall.  The pt reports no new symptoms or concerns since the last visit.  No fevers no chills no night sweats no unexpected sudden weight loss.  No significant new fatigue. No new lumps or bumps. No new bone pains. No visual changes new headaches or shortness of breath.  Labs done on 05/09/2021 showed normal CBC, normal CMP.  LDH within normal limits at 125.  Kappa lambda free light chains show a mild kappa free light chain increase to 21.1. Myeloma panel done on 05/31/2021 showed stable M protein at 1.8 g/dL which is unchanged over the last 5 months.  IFE shows IgG M kappa light chain specific protein   on review of systems, pt reports no other acute new symptoms.   MEDICAL HISTORY:  Past Medical History:  Diagnosis Date   Anginal pain (HCC)    Chest pain    Dyspnea    Fatigue    GERD (gastroesophageal reflux disease)    Hypothyroidism    SOB (shortness of breath)     SURGICAL HISTORY: Past Surgical History:  Procedure Laterality Date   NO PAST SURGERIES      SOCIAL HISTORY: Social History   Socioeconomic History   Marital status: Married    Spouse name: Not on file   Number of children: Not on file   Years of education: Not on file   Highest education level: Not on file  Occupational History   Not on file  Tobacco Use   Smoking status: Never   Smokeless tobacco: Never  Vaping Use   Vaping Use: Never used  Substance and Sexual Activity   Alcohol use: Yes    Alcohol/week: 3.0 standard drinks    Types: 3 Cans of beer per week   Drug use: Never   Sexual activity: Not on file  Other Topics Concern   Not on file  Social History Narrative    Not on file   Social Determinants of Health   Financial Resource Strain: Not on file  Food Insecurity: Not on file  Transportation Needs: Not on file  Physical Activity: Not on file  Stress: Not on file  Social Connections: Not on file  Intimate Partner Violence: Not on file    FAMILY HISTORY: Family History  Problem Relation Age of Onset   Hypertension Mother    Stroke Mother    Hyperlipidemia Mother    Pancreatic cancer Father    Healthy Sister    Healthy Brother     ALLERGIES:  is allergic to fish allergy.  MEDICATIONS:  Current Outpatient Medications  Medication Sig Dispense Refill  diphenhydrAMINE (BENADRYL) 25 MG tablet Take 25 mg by mouth every 6 (six) hours as needed.     EPINEPHrine 0.3 mg/0.3 mL IJ SOAJ injection Inject 1 Device into the muscle once as needed. Inject into thigh as need for allergic reaction.Seek medical attention after using. (Patient not taking: Reported on 01/03/2021)     levothyroxine (SYNTHROID, LEVOTHROID) 25 MCG tablet Take 25 mcg by mouth daily. (Patient not taking: Reported on 01/23/2021)     LORazepam (ATIVAN) 0.5 MG tablet Take 1 tablet (0.5 mg total) by mouth every 8 (eight) hours. 30 tablet 0   Multiple Vitamin (MULTIVITAMIN) tablet Take 1 tablet by mouth daily.     testosterone cypionate (DEPOTESTOSTERONE CYPIONATE) 200 MG/ML injection SMARTSIG:0.3 Milliliter(s) IM Twice a Week     thyroid (ARMOUR) 30 MG tablet Take by mouth.     No current facility-administered medications for this visit.    REVIEW OF SYSTEMS:   .10 Point review of Systems was done is negative except as noted above.   PHYSICAL EXAMINATION: ECOG PERFORMANCE STATUS: 1 - Symptomatic but completely ambulatory  Vitals:   05/31/21 0913  BP: 107/66  Pulse: (!) 51  Resp: 20  Temp: 97.6 F (36.4 C)  SpO2: 100%   Filed Weights   05/31/21 0913  Weight: 166 lb 14.4 oz (75.7 kg)   .Body mass index is 23.95 kg/m.  Marland Kitchen GENERAL:alert, in no acute distress and  comfortable SKIN: no acute rashes, no significant lesions EYES: conjunctiva are pink and non-injected, sclera anicteric OROPHARYNX: MMM, no exudates, no oropharyngeal erythema or ulceration NECK: supple, no JVD LYMPH:  no palpable lymphadenopathy in the cervical, axillary or inguinal regions LUNGS: clear to auscultation b/l with normal respiratory effort HEART: regular rate & rhythm ABDOMEN:  normoactive bowel sounds , non tender, not distended. Extremity: no pedal edema PSYCH: alert & oriented x 3 with fluent speech NEURO: no focal motor/sensory deficits   LABORATORY DATA:  I have reviewed the data as listed  CBC Latest Ref Rng & Units 05/09/2021 01/23/2021 01/03/2021  WBC 4.0 - 10.5 K/uL 6.8 5.6 8.5  Hemoglobin 13.0 - 17.0 g/dL 13.2 14.4 14.4  Hematocrit 39.0 - 52.0 % 38.4(L) 42.0 41.3  Platelets 150 - 400 K/uL 274 298 303    CMP Latest Ref Rng & Units 05/09/2021 01/03/2021  Glucose 70 - 99 mg/dL 97 108(H)  BUN 6 - 20 mg/dL 11 11  Creatinine 0.61 - 1.24 mg/dL 1.06 1.02  Sodium 135 - 145 mmol/L 140 140  Potassium 3.5 - 5.1 mmol/L 4.2 3.9  Chloride 98 - 111 mmol/L 104 103  CO2 22 - 32 mmol/L 27 27  Calcium 8.9 - 10.3 mg/dL 10.1 9.6  Total Protein 6.5 - 8.1 g/dL 7.7 8.2(H)  Total Bilirubin 0.3 - 1.2 mg/dL 0.7 1.0  Alkaline Phos 38 - 126 U/L 49 52  AST 15 - 41 U/L 19 21  ALT 0 - 44 U/L 11 15   01/23/2021 Surgical Pathology  -Monoclonal B cell population identified  -No aberrant T-cell phenotype identified  -See comment   COMMENT:  Flow cytometric analysis of the lymphoid population representing 19% of  all cells in the sample shows a monoclonal, kappa restricted B-cell  population representing 5% of all cells and expressing B-cell antigens  including CD20 with lack of CD5, CD10, CD25, CD38 or CD103 expression.  The pattern is nonspecific but consistent with involvement by a B-cell  lymphoproliferative process.  Admixed T-cells do not show an aberrant   phenotype.  RADIOGRAPHIC STUDIES: I have personally reviewed the radiological images as listed and agreed with the findings in the report. No results found.   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 -No indication to start treating the patient's Walden Strom's macroglobulinemia at this time. -Recommended pt de-stress, stay active, eat healthy, and get ample sleep. -Recommended pt drink 48-64 oz water daily. -Continue 2000 IU Vitamin D daily. Goal Vitamin D 60<>90. -Continue Baby ASA daily. -Will see back in 3 months with labs.    FOLLOW UP: Labs today RTC w Dr Irene Limbo in 4 months. Labs 1 week prior. (1st or 2nd week of January 2023)   All of the patients questions were answered with apparent satisfaction. The patient knows to call the clinic with any problems, questions or concerns.  The total time spent in the appointment was 30 minutes and more than 50% was on counseling and direct patient cares.   Sullivan Lone MD [place] AAHIVMS [facility] Hematology/Oncology Physician [facility]  (Office):       [phone] (Work cell):  [phone] (Fax):           [phone]

## 2021-09-29 IMAGING — CT CT CHEST-ABD-PELV W/ CM
3 of 5 series · 15 of 36 positions shown, 17 images · IV contrast (OMNIPAQUE)
Comparison: Abdominal ultrasound May 29, 2018 age

CLINICAL DATA: Hematologic malignancy, initial staging for newly
diagnosed wall and strong is macroglobulinemia.

EXAM:
CT CHEST, ABDOMEN, AND PELVIS WITH CONTRAST
TECHNIQUE: Multidetector CT imaging of the chest, abdomen and pelvis was
performed following the standard protocol during bolus
administration of intravenous contrast.
CONTRAST:  100mL OMNIPAQUE IOHEXOL 300 MG/ML  SOLN

[Series 2: cap with · axial · 0.67mm/px · z∈[-593,-88]mm · 9 of 127 slices shown, 11 images]
[im 13/127  mediastinal]
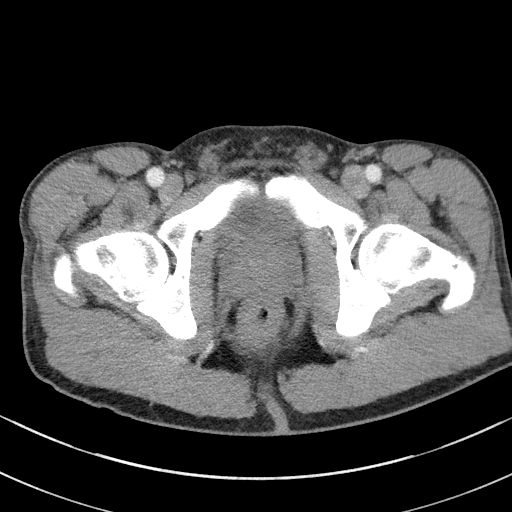
[im 13/127  bone]
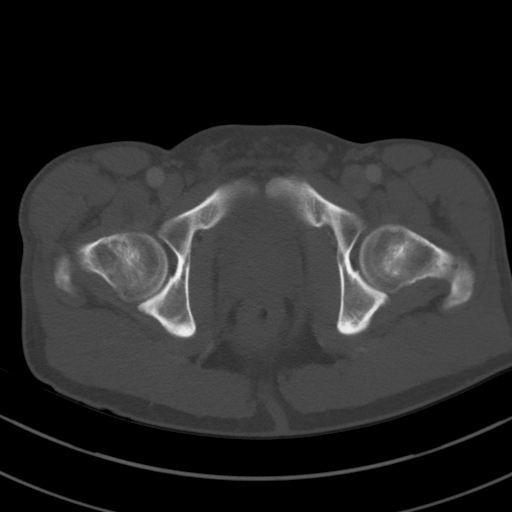
[im 26/127  mediastinal]
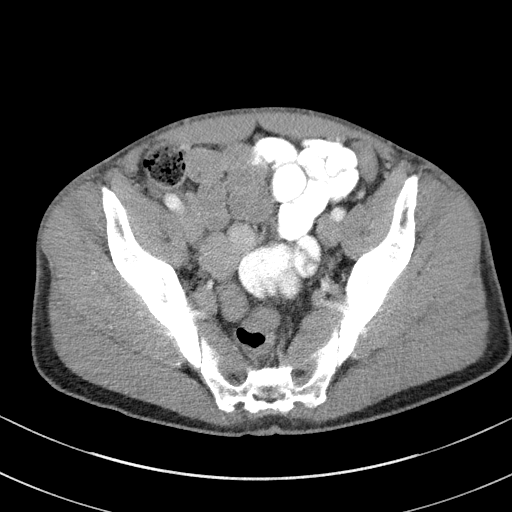
[im 38/127  mediastinal]
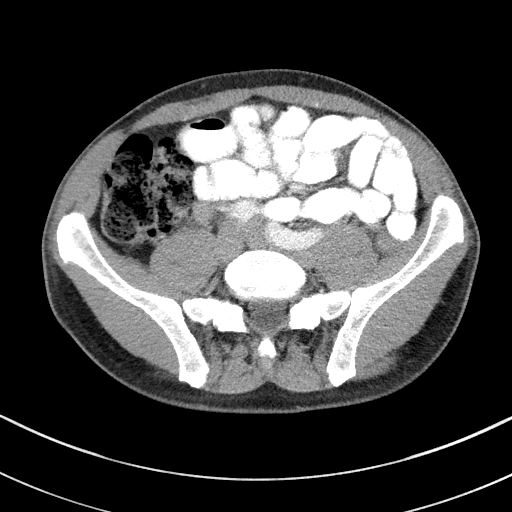
[im 51/127  mediastinal]
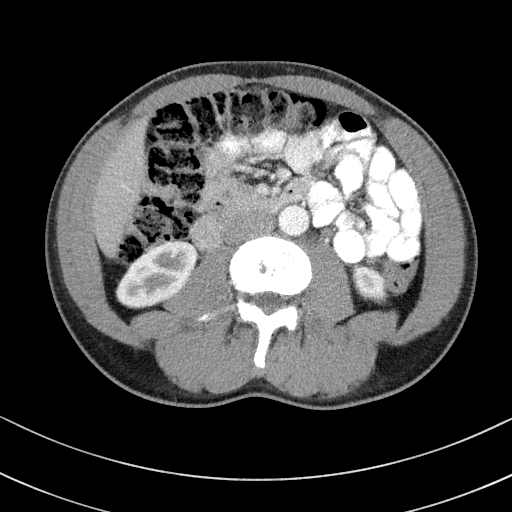
[im 64/127  mediastinal]
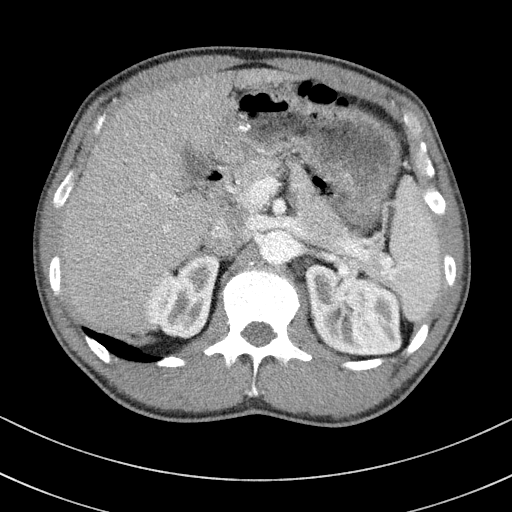
[im 76/127  mediastinal]
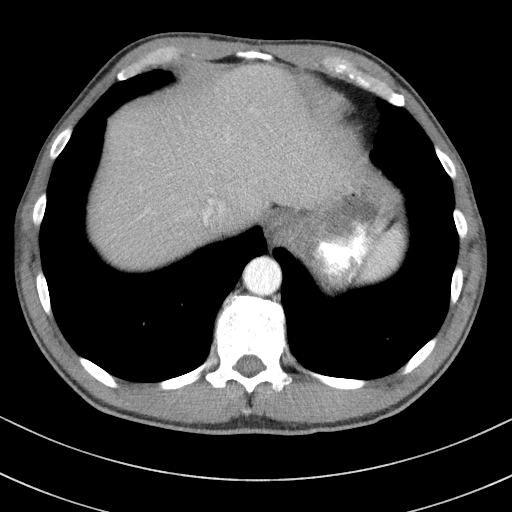
[im 89/127  mediastinal]
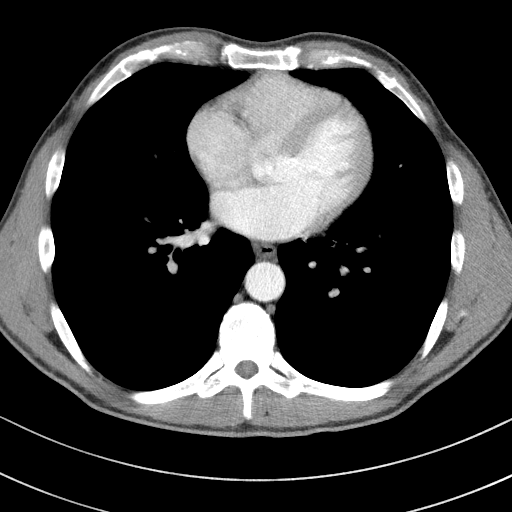
[im 101/127  mediastinal]
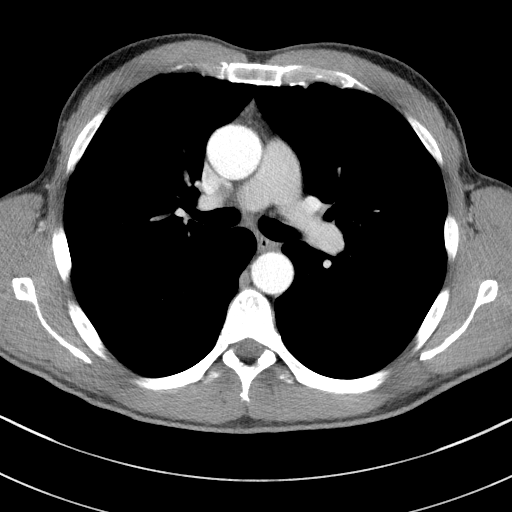
[im 114/127  mediastinal]
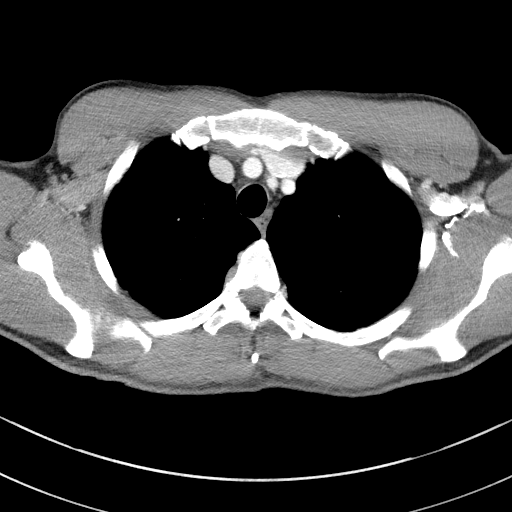
[im 114/127  bone]
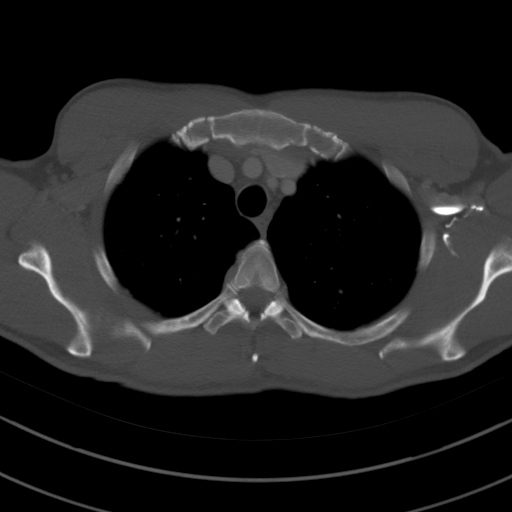

[Series 4: coronals · coronal · 0.84mm/px · 3 of 157 slices shown]
[im 32/157  mediastinal]
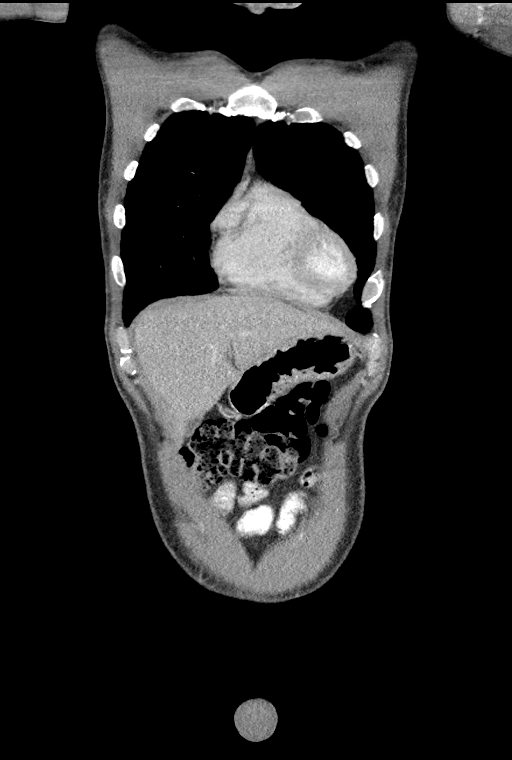
[im 63/157  mediastinal]
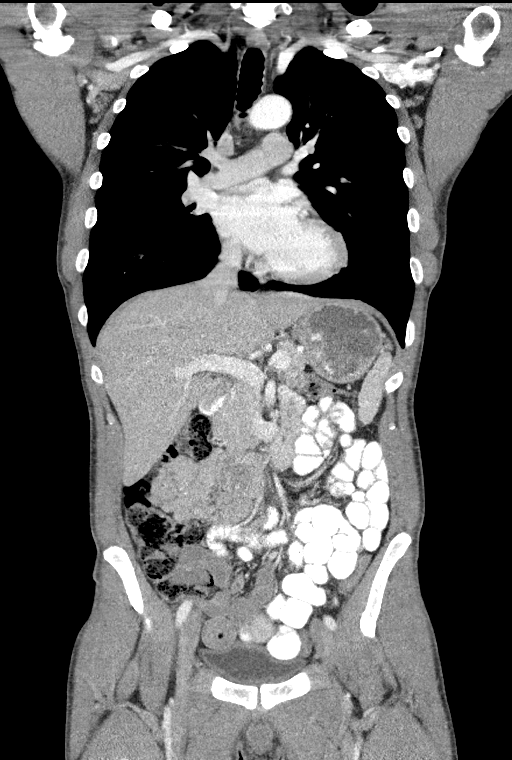
[im 94/157  mediastinal]
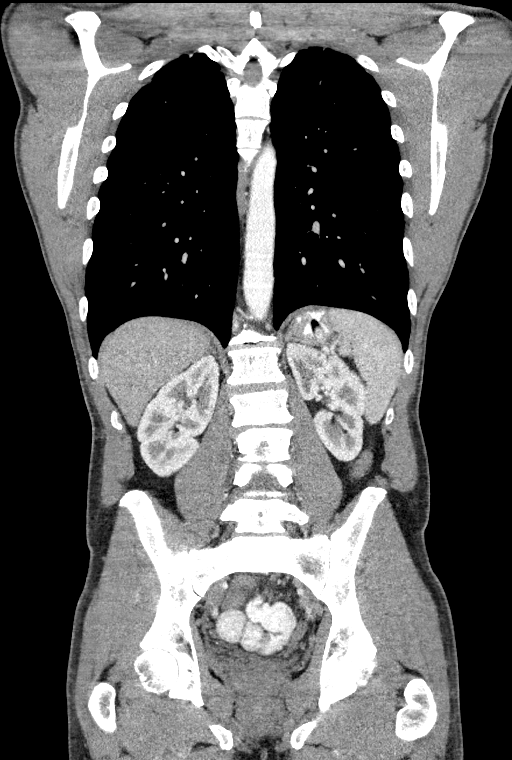

[Series 6: lung · axial · 0.67mm/px · z∈[-335,-257]mm · 3 of 169 slices shown]
[im 13/169  bone]
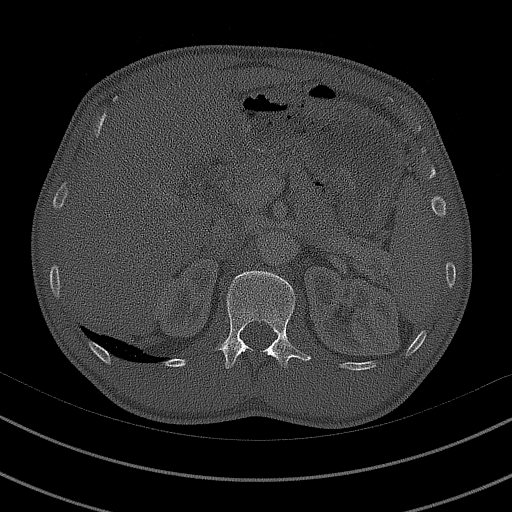
[im 39/169  bone]
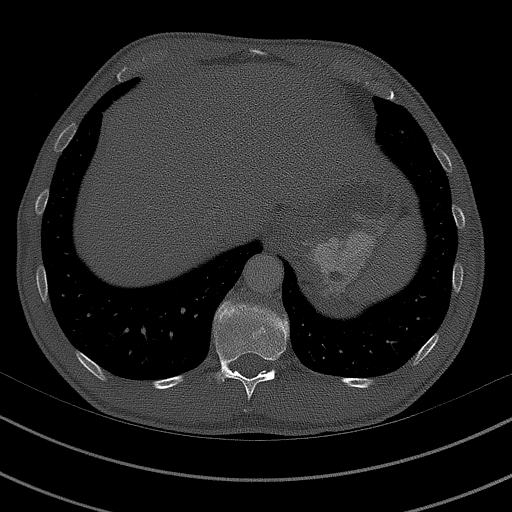
[im 52/169  bone]
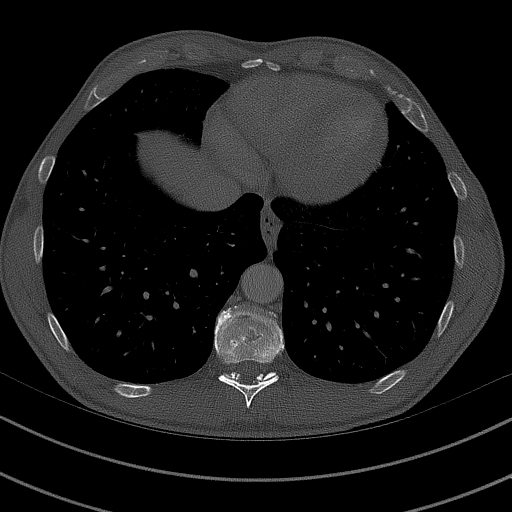

[15 of 36 positions shown; findings below may reference images not displayed]

FINDINGS: CT CHEST FINDINGS

Cardiovascular: No significant vascular findings. Normal heart size.
No pericardial effusion.

Mediastinum/Nodes: No enlarged mediastinal, hilar, or axillary lymph
nodes. Thyroid gland, trachea, and esophagus demonstrate no
significant findings.

Lungs/Pleura: Mild biapical pleuroparenchymal scarring. No
suspicious pulmonary nodules or masses. No focal consolidation. No
pleural effusion. No pneumothorax.

Musculoskeletal: No chest wall mass or suspicious bone lesions
identified.

CT ABDOMEN PELVIS FINDINGS

Hepatobiliary: No suspicious hepatic lesion. Gallbladder is
unremarkable. No biliary ductal dilation.

Pancreas: Unremarkable. No pancreatic ductal dilatation or
surrounding inflammatory changes.

Spleen: Normal in size without focal abnormality.

Adrenals/Urinary Tract: Adrenal glands are unremarkable. Kidneys are
normal, without renal calculi, focal lesion, or hydronephrosis.
Bladder is unremarkable.

Stomach/Bowel: Ingested enteric contrast visualized to the distal
small bowel. Stomach is grossly unremarkable. Normal positioning of
the duodenum/ligament of Treitz. No suspicious small bowel wall
thickening or dilation. The appendix is unremarkable. No suspicious
colonic wall thickening or mass like lesions visualized.

Vascular/Lymphatic: No significant vascular findings are present. No
gastrohepatic or hepatoduodenal ligament lymphadenopathy. No
retroperitoneal or mesenteric lymphadenopathy. No pelvic sidewall
lymphadenopathy. No groin lymphadenopathy.

Reproductive: Prostate is unremarkable.

Other: No abdominal wall hernia or abnormality. No abdominopelvic
ascites.

Musculoskeletal: Multilevel degenerative changes spine. Degenerative
changes bilateral hips. No aggressive lytic or blastic lesion of
bone.
IMPRESSION: 1. No acute findings within the chest, abdomen, or pelvis. No
lymphadenopathy.

## 2021-10-11 ENCOUNTER — Inpatient Hospital Stay: Payer: BC Managed Care – PPO | Attending: Hematology

## 2021-10-11 ENCOUNTER — Other Ambulatory Visit: Payer: Self-pay

## 2021-10-11 DIAGNOSIS — C880 Waldenstrom macroglobulinemia: Secondary | ICD-10-CM | POA: Diagnosis not present

## 2021-10-11 DIAGNOSIS — Z808 Family history of malignant neoplasm of other organs or systems: Secondary | ICD-10-CM | POA: Insufficient documentation

## 2021-10-11 LAB — CMP (CANCER CENTER ONLY)
ALT: 9 U/L (ref 0–44)
AST: 16 U/L (ref 15–41)
Albumin: 4.2 g/dL (ref 3.5–5.0)
Alkaline Phosphatase: 45 U/L (ref 38–126)
Anion gap: 4 — ABNORMAL LOW (ref 5–15)
BUN: 13 mg/dL (ref 6–20)
CO2: 31 mmol/L (ref 22–32)
Calcium: 9.7 mg/dL (ref 8.9–10.3)
Chloride: 103 mmol/L (ref 98–111)
Creatinine: 0.98 mg/dL (ref 0.61–1.24)
GFR, Estimated: >60 mL/min (ref >60)
Glucose, Bld: 119 mg/dL — ABNORMAL HIGH (ref 70–99)
Potassium: 4.1 mmol/L (ref 3.5–5.1)
Sodium: 138 mmol/L (ref 135–145)
Total Bilirubin: 0.9 mg/dL (ref 0.3–1.2)
Total Protein: 7.9 g/dL (ref 6.5–8.1)

## 2021-10-11 LAB — CBC WITH DIFFERENTIAL (CANCER CENTER ONLY)
Abs Immature Granulocytes: 0.01 10*3/uL (ref 0.00–0.07)
Basophils Absolute: 0 10*3/uL (ref 0.0–0.1)
Basophils Relative: 1 %
Eosinophils Absolute: 0.1 10*3/uL (ref 0.0–0.5)
Eosinophils Relative: 1 %
HCT: 39.7 % (ref 39.0–52.0)
Hemoglobin: 13.7 g/dL (ref 13.0–17.0)
Immature Granulocytes: 0 %
Lymphocytes Relative: 14 %
Lymphs Abs: 1.2 10*3/uL (ref 0.7–4.0)
MCH: 32 pg (ref 26.0–34.0)
MCHC: 34.5 g/dL (ref 30.0–36.0)
MCV: 92.8 fL (ref 80.0–100.0)
Monocytes Absolute: 0.4 10*3/uL (ref 0.1–1.0)
Monocytes Relative: 5 %
Neutro Abs: 6.3 10*3/uL (ref 1.7–7.7)
Neutrophils Relative %: 79 %
Platelet Count: 316 10*3/uL (ref 150–400)
RBC: 4.28 MIL/uL (ref 4.22–5.81)
RDW: 12.1 % (ref 11.5–15.5)
WBC Count: 8 10*3/uL (ref 4.0–10.5)
nRBC: 0 % (ref 0.0–0.2)

## 2021-10-11 LAB — LACTATE DEHYDROGENASE: LDH: 105 U/L (ref 98–192)

## 2021-10-18 ENCOUNTER — Inpatient Hospital Stay (HOSPITAL_BASED_OUTPATIENT_CLINIC_OR_DEPARTMENT_OTHER): Payer: BC Managed Care – PPO | Admitting: Hematology

## 2021-10-18 ENCOUNTER — Other Ambulatory Visit: Payer: Self-pay

## 2021-10-18 VITALS — BP 109/48 | HR 57 | Temp 97.7°F | Resp 18 | Wt 162.4 lb

## 2021-10-18 DIAGNOSIS — C880 Waldenstrom macroglobulinemia: Secondary | ICD-10-CM

## 2021-10-18 LAB — MULTIPLE MYELOMA PANEL, SERUM
Albumin SerPl Elph-Mcnc: 3.8 g/dL (ref 2.9–4.4)
Albumin/Glob SerPl: 1.1 (ref 0.7–1.7)
Alpha 1: 0.2 g/dL (ref 0.0–0.4)
Alpha2 Glob SerPl Elph-Mcnc: 0.5 g/dL (ref 0.4–1.0)
B-Globulin SerPl Elph-Mcnc: 0.7 g/dL (ref 0.7–1.3)
Gamma Glob SerPl Elph-Mcnc: 2.3 g/dL — ABNORMAL HIGH (ref 0.4–1.8)
Globulin, Total: 3.7 g/dL (ref 2.2–3.9)
IgA: 119 mg/dL (ref 90–386)
IgG (Immunoglobin G), Serum: 764 mg/dL (ref 603–1613)
IgM (Immunoglobulin M), Srm: 2252 mg/dL — ABNORMAL HIGH (ref 20–172)
M Protein SerPl Elph-Mcnc: 1.7 g/dL — ABNORMAL HIGH
Total Protein ELP: 7.5 g/dL (ref 6.0–8.5)

## 2021-10-24 NOTE — Progress Notes (Addendum)
HEMATOLOGY/ONCOLOGY CLINIC NOTE  Date of Service: .10/18/2021   Patient Care Team: [name], NP as PCP - General (Nurse Practitioner)  CHIEF COMPLAINTS/PURPOSE OF CONSULTATION:   Follow-up for continued evaluation and management of smoldering Walden Strom's macroglobulinemia  HISTORY OF PRESENTING ILLNESS:   Please see notes for details on initial presentation  Blodgett is here for his scheduled follow-up follow-up for smoldering Walden Strom's macroglobulinemia.  His last clinic visit with [place] was on 05/31/2021. He notes no acute new concerns over the last 4 to 5 months. He has been feeling well and has been eating and drinking well. No acute new symptoms.   No fevers no chills no night sweats no unexpected weight loss. No new lumps or bumps. No bone pains. No change in vision. No chest pain or shortness of breath Labs done 10/11/2021 reviewed with the patient.  MEDICAL HISTORY:  Past Medical History:  Diagnosis Date   Anginal pain (HCC)    Chest pain    Dyspnea    Fatigue    GERD (gastroesophageal reflux disease)    Hypothyroidism    SOB (shortness of breath)     SURGICAL HISTORY: Past Surgical History:  Procedure Laterality Date   NO PAST SURGERIES      SOCIAL HISTORY: Social History   Socioeconomic History   Marital status: Married    Spouse name: Not on file   Number of children: Not on file   Years of education: Not on file   Highest education level: Not on file  Occupational History   Not on file  Tobacco Use   Smoking status: Never   Smokeless tobacco: Never  Vaping Use   Vaping Use: Never used  Substance and Sexual Activity   Alcohol use: Yes    Alcohol/week: 3.0 standard drinks    Types: 3 Cans of beer per week   Drug use: Never   Sexual activity: Not on file  Other Topics Concern   Not on file  Social History Narrative   Not on file   Social Determinants of Health   Financial Resource Strain: Not  on file  Food Insecurity: Not on file  Transportation Needs: Not on file  Physical Activity: Not on file  Stress: Not on file  Social Connections: Not on file  Intimate Partner Violence: Not on file    FAMILY HISTORY: Family History  Problem Relation Age of Onset   Hypertension Mother    Stroke Mother    Hyperlipidemia Mother    Pancreatic cancer Father    Healthy Sister    Healthy Brother     ALLERGIES:  is allergic to fish allergy.  MEDICATIONS:  Current Outpatient Medications  Medication Sig Dispense Refill   diphenhydrAMINE (BENADRYL) 25 MG tablet Take 25 mg by mouth every 6 (six) hours as needed.     EPINEPHrine 0.3 mg/0.3 mL IJ SOAJ injection Inject 1 Device into the muscle once as needed. Inject into thigh as need for allergic reaction.Seek medical attention after using. (Patient not taking: Reported on 01/03/2021)     levothyroxine (SYNTHROID, LEVOTHROID) 25 MCG tablet Take 25 mcg by mouth daily. (Patient not taking: Reported on 01/23/2021)     LORazepam (ATIVAN) 0.5 MG tablet Take 1 tablet (0.5 mg total) by mouth every 8 (eight) hours. 30 tablet 0   Multiple Vitamin (MULTIVITAMIN) tablet Take 1 tablet by mouth daily.     testosterone cypionate (DEPOTESTOSTERONE CYPIONATE) 200 MG/ML injection SMARTSIG:0.3 Milliliter(s) IM Twice  a Week     thyroid (ARMOUR) 30 MG tablet Take by mouth.     No current facility-administered medications for this visit.    REVIEW OF SYSTEMS:   .10 Point review of Systems was done is negative except as noted above.  PHYSICAL EXAMINATION: ECOG PERFORMANCE STATUS: 1 - Symptomatic but completely ambulatory  Vitals:   10/18/21 1410  BP: (!) 109/48  Pulse: (!) 57  Resp: 18  Temp: 97.7 F (36.5 C)  SpO2: 99%   Filed Weights   10/18/21 1410  Weight: 162 lb 6.4 oz (73.7 kg)   .Body mass index is 23.3 kg/m.  Marland Kitchen GENERAL:alert, in no acute distress and comfortable SKIN: no acute rashes, no significant lesions EYES: conjunctiva are pink  and non-injected, sclera anicteric OROPHARYNX: MMM, no exudates, no oropharyngeal erythema or ulceration NECK: supple, no JVD LYMPH:  no palpable lymphadenopathy in the cervical, axillary or inguinal regions LUNGS: clear to auscultation b/l with normal respiratory effort HEART: regular rate & rhythm ABDOMEN:  normoactive bowel sounds , non tender, not distended. Extremity: no pedal edema PSYCH: alert & oriented x 3 with fluent speech NEURO: no focal motor/sensory deficits    LABORATORY DATA:  I have reviewed the data as listed  CBC Latest Ref Rng & Units 10/11/2021 05/09/2021 01/23/2021  WBC 4.0 - 10.5 K/uL 8.0 6.8 5.6  Hemoglobin 13.0 - 17.0 g/dL 13.7 13.2 14.4  Hematocrit 39.0 - 52.0 % 39.7 38.4(L) 42.0  Platelets 150 - 400 K/uL 316 274 298    CMP Latest Ref Rng & Units 10/11/2021 05/09/2021 01/03/2021  Glucose 70 - 99 mg/dL 119(H) 97 108(H)  BUN 6 - 20 mg/dL 13 11 11   Creatinine 0.61 - 1.24 mg/dL 0.98 1.06 1.02  Sodium 135 - 145 mmol/L 138 140 140  Potassium 3.5 - 5.1 mmol/L 4.1 4.2 3.9  Chloride 98 - 111 mmol/L 103 104 103  CO2 22 - 32 mmol/L 31 27 27   Calcium 8.9 - 10.3 mg/dL 9.7 10.1 9.6  Total Protein 6.5 - 8.1 g/dL 7.9 7.7 8.2(H)  Total Bilirubin 0.3 - 1.2 mg/dL 0.9 0.7 1.0  Alkaline Phos 38 - 126 U/L 45 49 52  AST 15 - 41 U/L 16 19 21   ALT 0 - 44 U/L 9 11 15    . Lab Results  Component Value Date   LDH 105 10/11/2021    01/23/2021 Surgical Pathology  -Monoclonal B cell population identified  -No aberrant T-cell phenotype identified  -See comment   COMMENT:  Flow cytometric analysis of the lymphoid population representing 19% of  all cells in the sample shows a monoclonal, kappa restricted B-cell  population representing 5% of all cells and expressing B-cell antigens  including CD20 with lack of CD5, CD10, CD25, CD38 or CD103 expression.  The pattern is nonspecific but consistent with involvement by a B-cell  lymphoproliferative process.  Admixed T-cells do not  show an aberrant  phenotype.  RADIOGRAPHIC STUDIES: I have personally reviewed the radiological images as listed and agreed with the findings in the report. No results found.   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  No hyperviscosity symptoms.  No new lymphadenopathy or hepatosplenomegaly.  -Overall patient has no lab or clinical evidence of progression of lymphoplasmacytic lymphoma. -No indication to initiate treatment for possible Walden Strom's macroglobulinemia at this time. -Recommended pt drink 48-64 oz water daily. -Continue 2000 IU Vitamin D daily. Goal Vitamin D 60<>90. -Continue Baby ASA daily.  FOLLOW UP: RTC w Dr Irene Limbo in 6 months. Labs 1 week prior.    All of the patients questions were answered with apparent satisfaction. The patient knows to call the clinic with any problems, questions or concerns.  Sullivan Lone MD [place] AAHIVMS [facility] Hematology/Oncology Physician [facility]

## 2022-01-10 ENCOUNTER — Encounter: Payer: Self-pay | Admitting: Gastroenterology

## 2022-01-16 ENCOUNTER — Ambulatory Visit (AMBULATORY_SURGERY_CENTER): Payer: BC Managed Care – PPO | Admitting: *Deleted

## 2022-01-16 VITALS — Ht 70.0 in | Wt 162.0 lb

## 2022-01-16 DIAGNOSIS — Z1211 Encounter for screening for malignant neoplasm of colon: Secondary | ICD-10-CM

## 2022-01-16 MED ORDER — NA SULFATE-K SULFATE-MG SULF 17.5-3.13-1.6 GM/177ML PO SOLN: 1.0000 | ORAL | 0 refills | Status: DC

## 2022-01-16 NOTE — Progress Notes (Signed)
Patient's pre-visit was done today over the phone with the patient. Name,DOB and address verified. Patient denies any allergies to Eggs and Soy. Patient denies any past surgeries. Patient is not taking any diet pills or blood thinners. No home Oxygen. Insurance confirmed with patient. ? ?Prep instructions sent to pt's MyChart -pt is aware. Patient understands to call us back with any questions or concerns. Patient is aware of our care-partner policy.  ? ?EMMI education assigned to the patient for the procedure, sent to [place] [facility].  ? ?

## 2022-02-06 ENCOUNTER — Encounter: Payer: BC Managed Care – PPO | Admitting: Gastroenterology

## 2022-02-14 ENCOUNTER — Encounter: Payer: Self-pay | Admitting: Gastroenterology

## 2022-02-21 ENCOUNTER — Encounter: Payer: BC Managed Care – PPO | Admitting: Gastroenterology

## 2022-03-18 ENCOUNTER — Telehealth: Payer: Self-pay | Admitting: Gastroenterology

## 2022-03-18 NOTE — Telephone Encounter (Signed)
noted 

## 2022-03-19 ENCOUNTER — Encounter: Payer: BC Managed Care – PPO | Admitting: Gastroenterology

## 2022-04-15 ENCOUNTER — Telehealth: Payer: Self-pay | Admitting: Hematology

## 2022-04-15 NOTE — Telephone Encounter (Signed)
Left message with rescheduled upcoming appointments due to provider's PAL.

## 2022-04-17 ENCOUNTER — Other Ambulatory Visit: Payer: BC Managed Care – PPO

## 2022-04-18 ENCOUNTER — Telehealth: Payer: Self-pay | Admitting: Gastroenterology

## 2022-04-18 NOTE — Telephone Encounter (Signed)
Patient called to cancel procedure tomorrow due to personal circumstances.  He has rescheduled for 8/25 at 11:30 a.m.

## 2022-04-19 ENCOUNTER — Encounter: Payer: BC Managed Care – PPO | Admitting: Gastroenterology

## 2022-04-24 ENCOUNTER — Ambulatory Visit: Payer: BC Managed Care – PPO | Admitting: Hematology

## 2022-05-06 ENCOUNTER — Other Ambulatory Visit: Payer: Self-pay

## 2022-05-06 DIAGNOSIS — C880 Waldenstrom macroglobulinemia: Secondary | ICD-10-CM

## 2022-05-08 ENCOUNTER — Other Ambulatory Visit: Payer: Self-pay

## 2022-05-08 ENCOUNTER — Inpatient Hospital Stay: Payer: BC Managed Care – PPO | Attending: Hematology

## 2022-05-08 DIAGNOSIS — C880 Waldenstrom macroglobulinemia: Secondary | ICD-10-CM | POA: Insufficient documentation

## 2022-05-08 DIAGNOSIS — Z800 Family history of malignant neoplasm of digestive organs: Secondary | ICD-10-CM | POA: Diagnosis not present

## 2022-05-08 LAB — CMP (CANCER CENTER ONLY)
ALT: 13 U/L (ref 0–44)
AST: 18 U/L (ref 15–41)
Albumin: 4.2 g/dL (ref 3.5–5.0)
Alkaline Phosphatase: 49 U/L (ref 38–126)
Anion gap: 4 — ABNORMAL LOW (ref 5–15)
BUN: 13 mg/dL (ref 6–20)
CO2: 31 mmol/L (ref 22–32)
Calcium: 9.3 mg/dL (ref 8.9–10.3)
Chloride: 103 mmol/L (ref 98–111)
Creatinine: 1.07 mg/dL (ref 0.61–1.24)
GFR, Estimated: >60 mL/min (ref >60)
Glucose, Bld: 92 mg/dL (ref 70–99)
Potassium: 4 mmol/L (ref 3.5–5.1)
Sodium: 138 mmol/L (ref 135–145)
Total Bilirubin: 0.9 mg/dL (ref 0.3–1.2)
Total Protein: 7.8 g/dL (ref 6.5–8.1)

## 2022-05-08 LAB — CBC WITH DIFFERENTIAL (CANCER CENTER ONLY)
Abs Immature Granulocytes: 0.01 10*3/uL (ref 0.00–0.07)
Basophils Absolute: 0.1 10*3/uL (ref 0.0–0.1)
Basophils Relative: 1 %
Eosinophils Absolute: 0.2 10*3/uL (ref 0.0–0.5)
Eosinophils Relative: 2 %
HCT: 40.5 % (ref 39.0–52.0)
Hemoglobin: 14 g/dL (ref 13.0–17.0)
Immature Granulocytes: 0 %
Lymphocytes Relative: 19 %
Lymphs Abs: 1.4 10*3/uL (ref 0.7–4.0)
MCH: 32.2 pg (ref 26.0–34.0)
MCHC: 34.6 g/dL (ref 30.0–36.0)
MCV: 93.1 fL (ref 80.0–100.0)
Monocytes Absolute: 0.6 10*3/uL (ref 0.1–1.0)
Monocytes Relative: 8 %
Neutro Abs: 5.1 10*3/uL (ref 1.7–7.7)
Neutrophils Relative %: 70 %
Platelet Count: 289 10*3/uL (ref 150–400)
RBC: 4.35 MIL/uL (ref 4.22–5.81)
RDW: 12 % (ref 11.5–15.5)
WBC Count: 7.3 10*3/uL (ref 4.0–10.5)
nRBC: 0 % (ref 0.0–0.2)

## 2022-05-08 LAB — LACTATE DEHYDROGENASE: LDH: 109 U/L (ref 98–192)

## 2022-05-13 LAB — MULTIPLE MYELOMA PANEL, SERUM
Albumin SerPl Elph-Mcnc: 3.8 g/dL (ref 2.9–4.4)
Albumin/Glob SerPl: 1.1 (ref 0.7–1.7)
Alpha 1: 0.2 g/dL (ref 0.0–0.4)
Alpha2 Glob SerPl Elph-Mcnc: 0.5 g/dL (ref 0.4–1.0)
B-Globulin SerPl Elph-Mcnc: 0.7 g/dL (ref 0.7–1.3)
Gamma Glob SerPl Elph-Mcnc: 2.3 g/dL — ABNORMAL HIGH (ref 0.4–1.8)
Globulin, Total: 3.7 g/dL (ref 2.2–3.9)
IgA: 103 mg/dL (ref 90–386)
IgG (Immunoglobin G), Serum: 687 mg/dL (ref 603–1613)
IgM (Immunoglobulin M), Srm: 2237 mg/dL — ABNORMAL HIGH (ref 20–172)
M Protein SerPl Elph-Mcnc: 1.8 g/dL — ABNORMAL HIGH
Total Protein ELP: 7.5 g/dL (ref 6.0–8.5)

## 2022-05-15 ENCOUNTER — Inpatient Hospital Stay (HOSPITAL_BASED_OUTPATIENT_CLINIC_OR_DEPARTMENT_OTHER): Payer: BC Managed Care – PPO | Admitting: Hematology

## 2022-05-15 ENCOUNTER — Other Ambulatory Visit: Payer: Self-pay

## 2022-05-15 VITALS — BP 105/71 | HR 44 | Temp 97.9°F | Resp 20 | Wt 162.4 lb

## 2022-05-15 DIAGNOSIS — C880 Waldenstrom macroglobulinemia: Secondary | ICD-10-CM | POA: Diagnosis not present

## 2022-05-16 ENCOUNTER — Telehealth: Payer: Self-pay | Admitting: Hematology

## 2022-05-16 NOTE — Telephone Encounter (Signed)
Scheduled follow-up appointments per 8/9 los. Patient is aware.

## 2022-05-22 NOTE — Progress Notes (Signed)
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  He notes no acute new symptoms since his last clinic visit.  No new fatigue.  No new bone pains.  No new lumps or bumps.  No fevers no chills no night sweats.  No unexpected weight loss. No new medications.  He notes that he is in a good mental and physical space and feels relaxed overall.. Labs done on 05/08/2022 were discussed in detail with the patient.  MEDICAL HISTORY:  Past Medical History:  Diagnosis Date   Anginal pain (HCC)    Chest pain    Dyspnea    Fatigue    GERD (gastroesophageal reflux disease)    Hypothyroidism    SOB (shortness of breath)     SURGICAL HISTORY: Past Surgical History:  Procedure Laterality Date   NO PAST SURGERIES      SOCIAL HISTORY: Social History   Socioeconomic History   Marital status: Married    Spouse name: Not on file   Number of children: Not on file   Years of education: Not on file   Highest education level: Not on file  Occupational History   Not on file  Tobacco Use   Smoking status: Never   Smokeless tobacco: Never  Vaping Use   Vaping Use: Never used  Substance and Sexual Activity   Alcohol use: Not Currently   Drug use: Never   Sexual activity: Not on file  Other Topics Concern   Not on file  Social History Narrative   Not on file   Social Determinants of Health   Financial Resource Strain: Not on file  Food Insecurity: Not on file  Transportation Needs: Not on file  Physical Activity: Not on file  Stress: Not  on file  Social Connections: Not on file  Intimate Partner Violence: Not on file    FAMILY HISTORY: Family History  Problem Relation Age of Onset   Colon polyps Mother    Hypertension Mother    Stroke Mother    Hyperlipidemia Mother    Pancreatic cancer Father    Healthy Sister    Healthy Brother    Colon cancer Neg Hx     ALLERGIES:  is allergic to fish allergy.  MEDICATIONS:  Current Outpatient Medications  Medication Sig Dispense Refill   Ascorbic Acid (VITAMIN C PO) Take by mouth.     Ergocalciferol 50 MCG (2000 UT) CAPS Take by mouth.     Multiple Vitamin (MULTIVITAMIN) tablet Take 1 tablet by mouth daily.     testosterone cypionate (DEPOTESTOSTERONE CYPIONATE) 200 MG/ML injection once a week.     thyroid (ARMOUR) 30 MG tablet Take by mouth.     zinc gluconate 50 MG tablet Take by mouth.     EPINEPHrine 0.3 mg/0.3 mL IJ SOAJ injection Inject 1 Device into the muscle once as needed. Inject into thigh as need for allergic reaction.Seek medical attention after using. (Patient not taking: Reported on 01/03/2021)     No current facility-administered medications for this visit.    REVIEW OF SYSTEMS:   10 Point review of Systems was done  is negative except as noted above.  PHYSICAL EXAMINATION: ECOG PERFORMANCE STATUS: 1 - Symptomatic but completely ambulatory  Vitals:   05/15/22 1049  BP: 105/71  Pulse: (!) 44  Resp: 20  Temp: 97.9 F (36.6 C)  SpO2: 100%   Filed Weights   05/15/22 1049  Weight: 162 lb 6.4 oz (73.7 kg)   .Body mass index is 23.3 kg/m.  Marland Kitchen  NAD GENERAL:alert, in no acute distress and comfortable SKIN: no acute rashes, no significant lesions EYES: conjunctiva are pink and non-injected, sclera anicteric OROPHARYNX: MMM, no exudates, no oropharyngeal erythema or ulceration NECK: supple, no JVD LYMPH:  no palpable lymphadenopathy in the cervical, axillary or inguinal regions LUNGS: clear to auscultation b/l with normal respiratory effort HEART:  regular rate & rhythm ABDOMEN:  normoactive bowel sounds , non tender, not distended. Extremity: no pedal edema PSYCH: alert & oriented x 3 with fluent speech NEURO: no focal motor/sensory deficits     LABORATORY DATA:  I have reviewed the data as listed     Latest Ref Rng & Units 05/08/2022   12:51 PM 10/11/2021    1:15 PM 05/09/2021    3:59 PM  CBC  WBC 4.0 - 10.5 K/uL 7.3  8.0  6.8   Hemoglobin 13.0 - 17.0 g/dL 14.0  13.7  13.2   Hematocrit 39.0 - 52.0 % 40.5  39.7  38.4   Platelets 150 - 400 K/uL 289  316  274        Latest Ref Rng & Units 05/08/2022   12:51 PM 10/11/2021    1:15 PM 05/09/2021    3:59 PM  CMP  Glucose 70 - 99 mg/dL 92  119  97   BUN 6 - 20 mg/dL '13  13  11   '$ Creatinine 0.61 - 1.24 mg/dL 1.07  0.98  1.06   Sodium 135 - 145 mmol/L 138  138  140   Potassium 3.5 - 5.1 mmol/L 4.0  4.1  4.2   Chloride 98 - 111 mmol/L 103  103  104   CO2 22 - 32 mmol/L '31  31  27   '$ Calcium 8.9 - 10.3 mg/dL 9.3  9.7  10.1   Total Protein 6.5 - 8.1 g/dL 7.8  7.9  7.7   Total Bilirubin 0.3 - 1.2 mg/dL 0.9  0.9  0.7   Alkaline Phos 38 - 126 U/L 49  45  49   AST 15 - 41 U/L '18  16  19   '$ ALT 0 - 44 U/L '13  9  11    '$ . Lab Results  Component Value Date   LDH 109 05/08/2022     01/23/2021 Surgical Pathology  -Monoclonal B cell population identified  -No aberrant T-cell phenotype identified  -See comment   COMMENT:  Flow cytometric analysis of the lymphoid population representing 19% of  all cells in the sample shows a monoclonal, kappa restricted B-cell  population representing 5% of all cells and expressing B-cell antigens  including CD20 with lack of CD5, CD10, CD25, CD38 or CD103 expression.  The pattern is nonspecific but consistent with involvement by a B-cell  lymphoproliferative process.  Admixed T-cells do not show an aberrant  phenotype.  RADIOGRAPHIC STUDIES: I have personally reviewed the radiological images as listed and agreed with the findings in the  report. No results found.   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 Patient's labs from 05/08/2022 were discussed in detail CBC CMP and LDH within normal limits SPEP shows stable IgM kappa M spike of 1.8 g/dL which remains unchanged. No clear indication to initiate treatment for the patient's smoldering Charlcie Cradle macroglobulinemia at this time. Patient will continue to drink at least 48 to 64 ounces of water daily. Continue vitamin D 2000 units daily Continue baby aspirin 81 mg p.o. daily   SPEP shows monoclonal IgM kappa protein at 1.7 g/dL which is stable CMP within normal limits  -Patient has no clinical symptoms suggestive of progression of lymphoplasmacytic lymphoma.  No hyperviscosity symptoms.  No new lymphadenopathy or hepatosplenomegaly.  -Overall patient has no lab or clinical evidence of progression of lymphoplasmacytic lymphoma. -No indication to initiate treatment for possible Walden Strom's macroglobulinemia at this time. -Recommended pt drink 48-64 oz water daily. -Continue 2000 IU Vitamin D daily. Goal Vitamin D 60<>90. -Continue Baby ASA daily.   FOLLOW UP: Phone visit with Dr Irene Limbo in 6 months Labs 1 week prior to phone visit  The total time spent in the appointment was 20 minutes*.  All of the patient's questions were answered with apparent satisfaction. The patient knows to call the clinic with any  problems, questions or concerns.   Sullivan Lone MD MS AAHIVMS [facility] Hematology/Oncology [facility]  .*Total Encounter Time as defined by the Centers for Medicare and Medicaid Services includes, in addition to the face-to-face time of a patient visit (documented in the note above) non-face-to-face time: obtaining and reviewing outside history, ordering and reviewing medications, tests or procedures, care coordination (communications with other health care professionals or caregivers) and documentation in the medical record.

## 2022-05-31 ENCOUNTER — Encounter: Payer: Self-pay | Admitting: Gastroenterology

## 2022-05-31 ENCOUNTER — Ambulatory Visit (AMBULATORY_SURGERY_CENTER): Payer: BC Managed Care – PPO | Admitting: Gastroenterology

## 2022-05-31 VITALS — BP 90/30 | HR 72 | Temp 99.3°F | Resp 18 | Ht 70.0 in | Wt 162.0 lb

## 2022-05-31 DIAGNOSIS — Z1211 Encounter for screening for malignant neoplasm of colon: Secondary | ICD-10-CM

## 2022-05-31 MED ORDER — SODIUM CHLORIDE 0.9 % IV SOLN: 500.0000 mL | Freq: Once | INTRAVENOUS | Status: AC

## 2022-05-31 NOTE — Op Note (Signed)
[facility] Patient Name: Nathaniel Oliver Procedure Date: 05/31/2022 11:59 AM MRN: [id number] Endoscopist: Nicki Reaper E. Candis Schatz , MD Age: 56 Referring MD:  Date of Birth: Mar 20, 1966 Gender: Male Account #: [id number] Procedure:                Colonoscopy Indications:              Screening for colorectal malignant neoplasm, This                            is the patient's first colonoscopy Medicines:                Monitored Anesthesia Care Procedure:                Pre-Anesthesia Assessment:                           - Prior to the procedure, a History and Physical                            was performed, and patient medications and                            allergies were reviewed. The patient's tolerance of                            previous anesthesia was also reviewed. The risks                            and benefits of the procedure and the sedation                            options and risks were discussed with the patient.                            All questions were answered, and informed consent                            was obtained. Prior Anticoagulants: The patient has                            taken no previous anticoagulant or antiplatelet                            agents. ASA Grade Assessment: II - A patient with                            mild systemic disease. After reviewing the risks                            and benefits, the patient was deemed in                            satisfactory condition to undergo the procedure.  After obtaining informed consent, the colonoscope                            was passed under direct vision. Throughout the                            procedure, the patient's blood pressure, pulse, and                            oxygen saturations were monitored continuously. The                            CF [id number] #[id number] was introduced through the anus                            and advanced to the  the cecum, identified by                            appendiceal orifice and ileocecal valve. The                            colonoscopy was performed without difficulty. The                            patient tolerated the procedure well. The quality                            of the bowel preparation was adequate. The                            ileocecal valve, appendiceal orifice, and rectum                            were photographed. The bowel preparation used was                            SUPREP via split dose instruction. Scope In: 12:10:21 PM Scope Out: 12:35:18 PM Scope Withdrawal Time: 0 hours 17 minutes 39 seconds  Total Procedure Duration: 0 hours 24 minutes 57 seconds  Findings:                 The perianal and digital rectal examinations were                            normal. Pertinent negatives include normal                            sphincter tone and no palpable rectal lesions.                           The colon (entire examined portion) appeared                            normal. A suspected flat polyp was seen in the  ascending colon. While preparing to remove the                            polyp, visualization was lost secondary to                            peristalsis. The polyp was not able to be relocated                            despite multiple re-examinations of the ascending                            colon                           The retroflexed view of the distal rectum and anal                            verge was normal and showed no anal or rectal                            abnormalities. Complications:            No immediate complications. Estimated Blood Loss:     Estimated blood loss: none. Impression:               - The entire examined colon is normal.                           - The distal rectum and anal verge are normal on                            retroflexion view.                           - No specimens  collected. Recommendation:           - Patient has a contact number available for                            emergencies. The signs and symptoms of potential                            delayed complications were discussed with the                            patient. Return to normal activities tomorrow.                            Written discharge instructions were provided to the                            patient.                           - Resume previous diet.                           -  Continue present medications.                           - Consider repeat colonoscopy in 5 years due to                            suspected polyp not removed. Akacia Boltz E. Candis Schatz, MD 05/31/2022 12:43:25 PM This report has been signed electronically.

## 2022-05-31 NOTE — Progress Notes (Signed)
VS completed by AS.  Pt's states no medical or surgical changes since previsit or office visit.

## 2022-05-31 NOTE — Patient Instructions (Signed)
Discharge instructions given. Handout on Hemorrhoids. Resume previous medications. YOU HAD AN ENDOSCOPIC PROCEDURE TODAY AT [facility]:   Refer to the procedure report that was given to you for any specific questions about what was found during the examination.  If the procedure report does not answer your questions, please call your gastroenterologist to clarify.  If you requested that your care partner not be given the details of your procedure findings, then the procedure report has been included in a sealed envelope for you to review at your convenience later.  YOU SHOULD EXPECT: Some feelings of bloating in the abdomen. Passage of more gas than usual.  Walking can help get rid of the air that was put into your GI tract during the procedure and reduce the bloating. If you had a lower endoscopy (such as a colonoscopy or flexible sigmoidoscopy) you may notice spotting of blood in your stool or on the toilet paper. If you underwent a bowel prep for your procedure, you may not have a normal bowel movement for a few days.  Please Note:  You might notice some irritation and congestion in your nose or some drainage.  This is from the oxygen used during your procedure.  There is no need for concern and it should clear up in a day or so.  SYMPTOMS TO REPORT IMMEDIATELY:  Following lower endoscopy (colonoscopy or flexible sigmoidoscopy):  Excessive amounts of blood in the stool  Significant tenderness or worsening of abdominal pains  Swelling of the abdomen that is new, acute  Fever of 100F or higher   For urgent or emergent issues, a gastroenterologist can be reached at any hour by calling [phone]. Do not use MyChart messaging for urgent concerns.    DIET:  We do recommend a small meal at first, but then you may proceed to your regular diet.  Drink plenty of fluids but you should avoid alcoholic beverages for 24 hours.  ACTIVITY:  You should plan to take it easy for the  rest of today and you should NOT DRIVE or use heavy machinery until tomorrow (because of the sedation medicines used during the test).    FOLLOW UP: Our staff will call the number listed on your records the next business day following your procedure.  We will call around 7:15- 8:00 am to check on you and address any questions or concerns that you may have regarding the information given to you following your procedure. If we do not reach you, we will leave a message.  If you develop any symptoms (ie: fever, flu-like symptoms, shortness of breath, cough etc.) before then, please call [phone].  If you test positive for Covid 19 in the 2 weeks post procedure, please call and report this information to [place].    If any biopsies were taken you will be contacted by phone or by letter within the next 1-3 weeks.  Please call us at [phone] if you have not heard about the biopsies in 3 weeks.    SIGNATURES/CONFIDENTIALITY: You and/or your care partner have signed paperwork which will be entered into your electronic medical record.  These signatures attest to the fact that that the information above on your After Visit Summary has been reviewed and is understood.  Full responsibility of the confidentiality of this discharge information lies with you and/or your care-partner.

## 2022-05-31 NOTE — Progress Notes (Signed)
Called to room to assist during endoscopic procedure.  Patient ID and intended procedure confirmed with present staff. Received instructions for my participation in the procedure from the performing physician.  

## 2022-05-31 NOTE — Progress Notes (Signed)
[facility] History and Physical   Primary Care Physician:  [name], NP   Reason for Procedure:   Colon cancer screening  Plan:    Screening colonoscopy     HPI: Nathaniel Oliver is a 56 y.o. male undergoing initial average risk screening colonoscopy.  He has no family history of colon cancer and no chronic GI symptoms.    Past Medical History:  Diagnosis Date   Anginal pain (HCC)    Chest pain    Dyspnea    Fatigue    GERD (gastroesophageal reflux disease)    Hypothyroidism    SOB (shortness of breath)     Past Surgical History:  Procedure Laterality Date   NO PAST SURGERIES      Prior to Admission medications   Medication Sig Start Date End Date Taking? Authorizing Provider  Ascorbic Acid (VITAMIN C PO) Take by mouth.   Yes [provider]  Ergocalciferol 50 MCG (2000 UT) CAPS Take by mouth.   Yes [provider]  Multiple Vitamin (MULTIVITAMIN) tablet Take 1 tablet by mouth daily.   Yes [provider]  thyroid (ARMOUR) 30 MG tablet Take by mouth. 04/21/19  Yes [provider]  zinc gluconate 50 MG tablet Take by mouth.   Yes [provider]  EPINEPHrine 0.3 mg/0.3 mL IJ SOAJ injection Inject 1 Device into the muscle once as needed. Inject into thigh as need for allergic reaction.Seek medical attention after using. Patient not taking: Reported on 01/03/2021 01/24/15   [provider]  testosterone cypionate (DEPOTESTOSTERONE CYPIONATE) 200 MG/ML injection once a week. 10/01/20   [provider]    Current Outpatient Medications  Medication Sig Dispense Refill   Ascorbic Acid (VITAMIN C PO) Take by mouth.     Ergocalciferol 50 MCG (2000 UT) CAPS Take by mouth.     Multiple Vitamin (MULTIVITAMIN) tablet Take 1 tablet by mouth daily.     thyroid (ARMOUR) 30 MG tablet Take by mouth.     zinc gluconate 50 MG tablet Take by mouth.     EPINEPHrine 0.3 mg/0.3 mL IJ SOAJ injection Inject 1  Device into the muscle once as needed. Inject into thigh as need for allergic reaction.Seek medical attention after using. (Patient not taking: Reported on 01/03/2021)     testosterone cypionate (DEPOTESTOSTERONE CYPIONATE) 200 MG/ML injection once a week.     Current Facility-Administered Medications  Medication Dose Route Frequency Provider Last Rate Last Admin   0.9 %  sodium chloride infusion  500 mL Intravenous Once Daryel November, MD        Allergies as of 05/31/2022 - Review Complete 05/31/2022  Allergen Reaction Noted   Fish allergy Swelling 06/15/2015    Family History  Problem Relation Age of Onset   Colon polyps Mother    Hypertension Mother    Stroke Mother    Hyperlipidemia Mother    Pancreatic cancer Father    Healthy Sister    Healthy Brother    Colon cancer Neg Hx     Social History   Socioeconomic History   Marital status: Married    Spouse name: Not on file   Number of children: Not on file   Years of education: Not on file   Highest education level: Not on file  Occupational History   Not on file  Tobacco Use   Smoking status: Never   Smokeless tobacco: Never  Vaping Use   Vaping Use: Never used  Substance and Sexual  Activity   Alcohol use: Not Currently   Drug use: Never   Sexual activity: Not on file  Other Topics Concern   Not on file  Social History Narrative   Not on file   Social Determinants of Health   Financial Resource Strain: Not on file  Food Insecurity: Not on file  Transportation Needs: Not on file  Physical Activity: Not on file  Stress: Not on file  Social Connections: Not on file  Intimate Partner Violence: Not on file    Review of Systems:  All other review of systems negative except as mentioned in the HPI.  Physical Exam: Vital signs BP 105/67   Pulse (!) 53   Temp 99.3 F (37.4 C) (Temporal)   Ht '5\' 10"'$  (1.778 m)   Wt 162 lb (73.5 kg)   SpO2 99%   BMI 23.24 kg/m   General:   Alert,  Well-developed,  well-nourished, pleasant and cooperative in NAD Airway:  Mallampati 1 Lungs:  Clear throughout to auscultation.   Heart:  Regular rate and rhythm; no murmurs, clicks, rubs,  or gallops. Abdomen:  Soft, nontender and nondistended. Normal bowel sounds.   Neuro/Psych:  Normal mood and affect. A and O x 3   Nathaniel Mezera E. Candis Schatz, MD [facility]

## 2022-05-31 NOTE — Progress Notes (Signed)
Vss nad trans to pacu °

## 2022-06-03 ENCOUNTER — Telehealth: Payer: Self-pay | Admitting: *Deleted

## 2022-06-03 NOTE — Telephone Encounter (Signed)
Left message on f/u call 

## 2022-11-07 ENCOUNTER — Other Ambulatory Visit: Payer: Self-pay

## 2022-11-07 DIAGNOSIS — C880 Waldenstrom macroglobulinemia: Secondary | ICD-10-CM

## 2022-11-08 ENCOUNTER — Other Ambulatory Visit: Payer: Self-pay

## 2022-11-08 ENCOUNTER — Inpatient Hospital Stay: Payer: Managed Care, Other (non HMO) | Attending: Hematology

## 2022-11-08 DIAGNOSIS — Z800 Family history of malignant neoplasm of digestive organs: Secondary | ICD-10-CM | POA: Insufficient documentation

## 2022-11-08 DIAGNOSIS — C880 Waldenstrom macroglobulinemia: Secondary | ICD-10-CM | POA: Insufficient documentation

## 2022-11-08 LAB — CBC WITH DIFFERENTIAL (CANCER CENTER ONLY)
Abs Immature Granulocytes: 0.01 10*3/uL (ref 0.00–0.07)
Basophils Absolute: 0.1 10*3/uL (ref 0.0–0.1)
Basophils Relative: 1 %
Eosinophils Absolute: 0.3 10*3/uL (ref 0.0–0.5)
Eosinophils Relative: 4 %
HCT: 41.8 % (ref 39.0–52.0)
Hemoglobin: 14.3 g/dL (ref 13.0–17.0)
Immature Granulocytes: 0 %
Lymphocytes Relative: 21 %
Lymphs Abs: 1.7 10*3/uL (ref 0.7–4.0)
MCH: 31.2 pg (ref 26.0–34.0)
MCHC: 34.2 g/dL (ref 30.0–36.0)
MCV: 91.3 fL (ref 80.0–100.0)
Monocytes Absolute: 0.7 10*3/uL (ref 0.1–1.0)
Monocytes Relative: 9 %
Neutro Abs: 5.3 10*3/uL (ref 1.7–7.7)
Neutrophils Relative %: 65 %
Platelet Count: 274 10*3/uL (ref 150–400)
RBC: 4.58 MIL/uL (ref 4.22–5.81)
RDW: 12.2 % (ref 11.5–15.5)
WBC Count: 8.2 10*3/uL (ref 4.0–10.5)
nRBC: 0 % (ref 0.0–0.2)

## 2022-11-08 LAB — CMP (CANCER CENTER ONLY)
ALT: 12 U/L (ref 0–44)
AST: 20 U/L (ref 15–41)
Albumin: 3.9 g/dL (ref 3.5–5.0)
Alkaline Phosphatase: 56 U/L (ref 38–126)
Anion gap: 4 — ABNORMAL LOW (ref 5–15)
BUN: 12 mg/dL (ref 6–20)
CO2: 31 mmol/L (ref 22–32)
Calcium: 9.7 mg/dL (ref 8.9–10.3)
Chloride: 103 mmol/L (ref 98–111)
Creatinine: 1.1 mg/dL (ref 0.61–1.24)
GFR, Estimated: >60 mL/min (ref >60)
Glucose, Bld: 78 mg/dL (ref 70–99)
Potassium: 4.3 mmol/L (ref 3.5–5.1)
Sodium: 138 mmol/L (ref 135–145)
Total Bilirubin: 0.9 mg/dL (ref 0.3–1.2)
Total Protein: 7.6 g/dL (ref 6.5–8.1)

## 2022-11-08 LAB — LACTATE DEHYDROGENASE: LDH: 137 U/L (ref 98–192)

## 2022-11-14 LAB — MULTIPLE MYELOMA PANEL, SERUM
Albumin SerPl Elph-Mcnc: 4 g/dL (ref 2.9–4.4)
Albumin/Glob SerPl: 1.1 (ref 0.7–1.7)
Alpha 1: 0.2 g/dL (ref 0.0–0.4)
Alpha2 Glob SerPl Elph-Mcnc: 0.5 g/dL (ref 0.4–1.0)
B-Globulin SerPl Elph-Mcnc: 0.7 g/dL (ref 0.7–1.3)
Gamma Glob SerPl Elph-Mcnc: 2.4 g/dL — ABNORMAL HIGH (ref 0.4–1.8)
Globulin, Total: 3.7 g/dL (ref 2.2–3.9)
IgA: 111 mg/dL (ref 90–386)
IgG (Immunoglobin G), Serum: 737 mg/dL (ref 603–1613)
IgM (Immunoglobulin M), Srm: 2704 mg/dL — ABNORMAL HIGH (ref 20–172)
M Protein SerPl Elph-Mcnc: 1.5 g/dL — ABNORMAL HIGH
Total Protein ELP: 7.7 g/dL (ref 6.0–8.5)

## 2022-11-15 ENCOUNTER — Inpatient Hospital Stay (HOSPITAL_BASED_OUTPATIENT_CLINIC_OR_DEPARTMENT_OTHER): Payer: Managed Care, Other (non HMO) | Admitting: Hematology

## 2022-11-15 DIAGNOSIS — C880 Waldenstrom macroglobulinemia: Secondary | ICD-10-CM

## 2022-11-15 NOTE — Progress Notes (Signed)
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    Patient was last seen by me on 05/15/22 and was doing well overall with no new medical concerns.  I connected with Nathaniel Oliver on 11/15/22  at  3:30 PM EST by telephone visit and verified that I am speaking with the correct person using two identifiers.   I discussed the limitations, risks, security and privacy concerns of performing an evaluation and management service by telemedicine and the availability of in-person appointments. I also discussed with the patient that there may be a patient responsible charge related to this service. The patient expressed understanding and agreed to proceed.   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 However, he notes that he recovered from from the influenza infection a month ago, which lasted 10 days. He also notes that he has completely discontinued his alcohol consumption. No fevers/chills/night sweats. No new lumps or bumps.   MEDICAL HISTORY:  Past Medical History:  Diagnosis Date   Anginal pain (HCC)    Chest pain    Dyspnea    Fatigue    GERD (gastroesophageal reflux disease)    Hypothyroidism    SOB (shortness of breath)     SURGICAL HISTORY: Past  Surgical History:  Procedure Laterality Date   NO PAST SURGERIES      SOCIAL HISTORY: Social History   Socioeconomic History   Marital status: Married    Spouse name: Not on file   Number of children: Not on file   Years of education: Not on file   Highest education level: Not on file  Occupational History   Not on file  Tobacco Use   Smoking status: Never   Smokeless tobacco: Never  Vaping Use   Vaping Use: Never used  Substance and Sexual Activity   Alcohol use: Not Currently   Drug use: Never   Sexual activity: Not on file  Other Topics Concern   Not on file  Social History Narrative   Not on file   Social Determinants of Health   Financial Resource Strain: Not on file  Food Insecurity: Not on file  Transportation Needs: Not on file  Physical Activity: Not on file  Stress: Not on file  Social Connections: Not on file  Intimate Partner Violence: Not on file    FAMILY HISTORY: Family History  Problem Relation Age of Onset   Colon polyps Mother    Hypertension Mother    Stroke Mother    Hyperlipidemia Mother    Pancreatic cancer Father    Healthy Sister    Healthy Brother    Colon cancer Neg Hx     ALLERGIES:  is allergic to fish allergy.  MEDICATIONS:  Current Outpatient Medications  Medication Sig Dispense Refill   Ascorbic Acid (VITAMIN C PO) Take by mouth.     EPINEPHrine  0.3 mg/0.3 mL IJ SOAJ injection Inject 1 Device into the muscle once as needed. Inject into thigh as need for allergic reaction.Seek medical attention after using. (Patient not taking: Reported on 01/03/2021)     Ergocalciferol 50 MCG (2000 UT) CAPS Take by mouth.     Multiple Vitamin (MULTIVITAMIN) tablet Take 1 tablet by mouth daily.     testosterone cypionate (DEPOTESTOSTERONE CYPIONATE) 200 MG/ML injection once a week.     thyroid (ARMOUR) 30 MG tablet Take by mouth.     zinc gluconate 50 MG tablet Take by mouth.     Current Facility-Administered Medications  Medication Dose  Route Frequency Provider Last Rate Last Admin   0.9 %  sodium chloride infusion  500 mL Intravenous Once Daryel November, MD        REVIEW OF SYSTEMS:    10 Point review of Systems was done is negative except as noted above.   PHYSICAL EXAMINATION: Telephone Visit   LABORATORY DATA:  I have reviewed the data as listed     Latest Ref Rng & Units 11/08/2022    1:06 PM 05/08/2022   12:51 PM 10/11/2021    1:15 PM  CBC  WBC 4.0 - 10.5 K/uL 8.2  7.3  8.0   Hemoglobin 13.0 - 17.0 g/dL 14.3  14.0  13.7   Hematocrit 39.0 - 52.0 % 41.8  40.5  39.7   Platelets 150 - 400 K/uL 274  289  316        Latest Ref Rng & Units 11/08/2022    1:06 PM 05/08/2022   12:51 PM 10/11/2021    1:15 PM  CMP  Glucose 70 - 99 mg/dL 78  92  119   BUN 6 - 20 mg/dL 12  13  13   $ Creatinine 0.61 - 1.24 mg/dL 1.10  1.07  0.98   Sodium 135 - 145 mmol/L 138  138  138   Potassium 3.5 - 5.1 mmol/L 4.3  4.0  4.1   Chloride 98 - 111 mmol/L 103  103  103   CO2 22 - 32 mmol/L 31  31  31   $ Calcium 8.9 - 10.3 mg/dL 9.7  9.3  9.7   Total Protein 6.5 - 8.1 g/dL 7.6  7.8  7.9   Total Bilirubin 0.3 - 1.2 mg/dL 0.9  0.9  0.9   Alkaline Phos 38 - 126 U/L 56  49  45   AST 15 - 41 U/L 20  18  16   $ ALT 0 - 44 U/L 12  13  9    $ . Lab Results  Component Value Date   LDH 137 11/08/2022     01/23/2021 Surgical Pathology  -Monoclonal B cell population identified  -No aberrant T-cell phenotype identified  -See comment   COMMENT:  Flow cytometric analysis of the lymphoid population representing 19% of  all cells in the sample shows a monoclonal, kappa restricted B-cell  population representing 5% of all cells and expressing B-cell antigens  including CD20 with lack of CD5, CD10, CD25, CD38 or CD103 expression.  The pattern is nonspecific but consistent with involvement by a B-cell  lymphoproliferative process.  Admixed T-cells do not show an aberrant  phenotype.  RADIOGRAPHIC STUDIES: I have personally reviewed the  radiological images as listed and agreed with the findings in the report. No results found.   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 Blood counts unchanged. CBC showed WBC of 8.2 K, hemoglobin of 14.3, and platelets of 274 K. CMP normal. No change in kidney function. NO significant change in electrolytes.  -LDH normal -Amount of Abnormal protein floating in body is stable.Myeloma lab revealed that  M-protein improved from 1.8 to 1.5. -Patient has no lab evidence or clinical signs or symptoms of progression of his smoldering Walden Strom's macroglobulinemia at this time. -Advised pt to continue to consume a healthy diet and stay physically active  FOLLOW UP: Phone visit with Dr Irene Limbo in 6 months Labs 1 week prior to phone visit  The total time spent in the appointment was 10 minutes* .  All of the patient's questions were answered with apparent satisfaction. The patient knows to call the clinic with any problems, questions or concerns.   Sullivan Lone MD MS AAHIVMS [facility] Hematology/Oncology Physician [facility]  .*Total Encounter Time as defined by the Centers for Medicare and Medicaid Services includes, in addition to the face-to-face time of a patient visit (documented in the note above) non-face-to-face time: obtaining and reviewing outside history, ordering and reviewing medications, tests or procedures, care coordination (communications with other health care professionals or  caregivers) and documentation in the medical record.   I,Mitra Faeizi,acting as a Education administrator for Sullivan Lone, MD.,have documented all relevant documentation on the behalf of Sullivan Lone, MD,as directed by  Sullivan Lone, MD while in the presence of Sullivan Lone, MD.  .I have reviewed the above documentation for accuracy and completeness, and I agree with the above. Brunetta Genera MD

## 2022-11-22 ENCOUNTER — Telehealth: Payer: Self-pay | Admitting: Hematology

## 2022-11-22 NOTE — Telephone Encounter (Signed)
Per 2/16 IB reached out to patient to schedule labs + telephone visit, Patient aware and confirmed.

## 2023-05-12 ENCOUNTER — Other Ambulatory Visit: Payer: Self-pay

## 2023-05-12 DIAGNOSIS — C880 Waldenstrom macroglobulinemia: Secondary | ICD-10-CM

## 2023-05-13 ENCOUNTER — Inpatient Hospital Stay: Payer: Managed Care, Other (non HMO) | Attending: Hematology

## 2023-05-13 ENCOUNTER — Other Ambulatory Visit: Payer: Self-pay

## 2023-05-13 DIAGNOSIS — R768 Other specified abnormal immunological findings in serum: Secondary | ICD-10-CM | POA: Diagnosis not present

## 2023-05-13 DIAGNOSIS — C880 Waldenstrom macroglobulinemia: Secondary | ICD-10-CM | POA: Diagnosis present

## 2023-05-13 DIAGNOSIS — Z800 Family history of malignant neoplasm of digestive organs: Secondary | ICD-10-CM | POA: Diagnosis not present

## 2023-05-13 LAB — CMP (CANCER CENTER ONLY)
ALT: 14 U/L (ref 0–44)
AST: 22 U/L (ref 15–41)
Albumin: 4.1 g/dL (ref 3.5–5.0)
Alkaline Phosphatase: 56 U/L (ref 38–126)
Anion gap: 3 — ABNORMAL LOW (ref 5–15)
BUN: 15 mg/dL (ref 6–20)
CO2: 31 mmol/L (ref 22–32)
Calcium: 9.4 mg/dL (ref 8.9–10.3)
Chloride: 103 mmol/L (ref 98–111)
Creatinine: 1.22 mg/dL (ref 0.61–1.24)
GFR, Estimated: >60 mL/min (ref >60)
Glucose, Bld: 83 mg/dL (ref 70–99)
Potassium: 4.4 mmol/L (ref 3.5–5.1)
Sodium: 137 mmol/L (ref 135–145)
Total Bilirubin: 1.1 mg/dL (ref 0.3–1.2)
Total Protein: 8.1 g/dL (ref 6.5–8.1)

## 2023-05-13 LAB — CBC WITH DIFFERENTIAL (CANCER CENTER ONLY)
Abs Immature Granulocytes: 0.01 10*3/uL (ref 0.00–0.07)
Basophils Absolute: 0.1 10*3/uL (ref 0.0–0.1)
Basophils Relative: 1 %
Eosinophils Absolute: 0.5 10*3/uL (ref 0.0–0.5)
Eosinophils Relative: 7 %
HCT: 44.2 % (ref 39.0–52.0)
Hemoglobin: 14.9 g/dL (ref 13.0–17.0)
Immature Granulocytes: 0 %
Lymphocytes Relative: 17 %
Lymphs Abs: 1.4 10*3/uL (ref 0.7–4.0)
MCH: 31.2 pg (ref 26.0–34.0)
MCHC: 33.7 g/dL (ref 30.0–36.0)
MCV: 92.7 fL (ref 80.0–100.0)
Monocytes Absolute: 0.6 10*3/uL (ref 0.1–1.0)
Monocytes Relative: 8 %
Neutro Abs: 5.3 10*3/uL (ref 1.7–7.7)
Neutrophils Relative %: 67 %
Platelet Count: 303 10*3/uL (ref 150–400)
RBC: 4.77 MIL/uL (ref 4.22–5.81)
RDW: 12.1 % (ref 11.5–15.5)
WBC Count: 7.8 10*3/uL (ref 4.0–10.5)
nRBC: 0 % (ref 0.0–0.2)

## 2023-05-13 LAB — LACTATE DEHYDROGENASE: LDH: 128 U/L (ref 98–192)

## 2023-05-20 ENCOUNTER — Inpatient Hospital Stay (HOSPITAL_BASED_OUTPATIENT_CLINIC_OR_DEPARTMENT_OTHER): Payer: Managed Care, Other (non HMO) | Admitting: Hematology

## 2023-05-20 DIAGNOSIS — C880 Waldenstrom macroglobulinemia: Secondary | ICD-10-CM | POA: Diagnosis not present

## 2023-05-20 NOTE — Progress Notes (Signed)
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    I connected with Carmelia Bake on 05/20/23  at  3:30 PM EDT by telephone visit and verified that I am speaking with the correct person using two identifiers.   I last connected with the patient via phone on 11/15/2022 and he was doing well overall.   Patient notes he has been doing well overall without any new or severe medical concerns since our last visit. He denies any new infection issues, fever, chills, night sweats, unexpected weight loss, headache, dizziness, chest pain, back pain, or leg swelling.   He does report of recent tick bites and he was treated with doxycycline around 3-4 weeks ago.   Patient notes he has been eating and sleeping well.    I discussed the limitations, risks, security and privacy concerns of performing an evaluation and management service by telemedicine and the availability of in-person appointments. I also discussed with the patient that there may be a patient responsible charge related to this service. The patient expressed understanding and agreed to proceed.   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    MEDICAL HISTORY:  Past Medical History:  Diagnosis Date   Anginal pain (HCC)    Chest pain    Dyspnea    Fatigue    GERD (gastroesophageal reflux  disease)    Hypothyroidism    SOB (shortness of breath)     SURGICAL HISTORY: Past Surgical History:  Procedure Laterality Date   NO PAST SURGERIES      SOCIAL HISTORY: Social History   Socioeconomic History   Marital status: Married    Spouse name: Not on file   Number of children: Not on file   Years of education: Not on file   Highest education level: Not on file  Occupational History   Not on file  Tobacco Use   Smoking status: Never   Smokeless tobacco: Never  Vaping Use   Vaping status: Never Used  Substance and Sexual Activity   Alcohol use: Not Currently   Drug use: Never   Sexual activity: Not on file  Other Topics Concern   Not on file  Social History Narrative   Not on file   Social Determinants of Health   Financial Resource Strain: Low Risk  (03/28/2023)   Received from [facility]   Overall Financial Resource Strain (CARDIA)    Difficulty of Paying Living Expenses: Not hard at all  Food Insecurity: No Food Insecurity (03/28/2023)   [facility] from [facility]   Hunger Vital Sign    Worried About Running Out of Food in the Last Year: Never true    Ran Out of Food in the Last Year: Never true  Transportation Needs: No Transportation Needs (03/28/2023)   Received from [facility] - Transportation    Lack of Transportation (Medical): No    Lack of Transportation (Non-Medical): No  Physical  Activity: Sufficiently Active (03/28/2023)   Received from [facility]   Exercise Vital Sign    Days of Exercise per Week: 5 days    Minutes of Exercise per Session: 50 min  Stress: No Stress Concern Present (03/28/2023)   Received from [facility] - Occupational Stress Questionnaire    Feeling of Stress : Only a little  Social Connections: Socially Integrated (03/28/2023)   Received from [facility]   Social Network    How would you rate your social network (family, work, friends)?: Good participation  with social networks  Intimate Partner Violence: Not At Risk (03/28/2023)   Received from [facility]   HITS    Over the last 12 months how often did your partner physically hurt you?: 1    Over the last 12 months how often did your partner insult you or talk down to you?: 1    Over the last 12 months how often did your partner threaten you with physical harm?: 1    Over the last 12 months how often did your partner scream or curse at you?: 1    FAMILY HISTORY: Family History  Problem Relation Age of Onset   Colon polyps Mother    Hypertension Mother    Stroke Mother    Hyperlipidemia Mother    Pancreatic cancer Father    Healthy Sister    Healthy Brother    Colon cancer Neg Hx     ALLERGIES:  is allergic to fish allergy.  MEDICATIONS:  Current Outpatient Medications  Medication Sig Dispense Refill   Ascorbic Acid (VITAMIN C PO) Take by mouth.     EPINEPHrine 0.3 mg/0.3 mL IJ SOAJ injection Inject 1 Device into the muscle once as needed. Inject into thigh as need for allergic reaction.Seek medical attention after using. (Patient not taking: Reported on 01/03/2021)     Ergocalciferol 50 MCG (2000 UT) CAPS Take by mouth.     Multiple Vitamin (MULTIVITAMIN) tablet Take 1 tablet by mouth daily.     testosterone cypionate (DEPOTESTOSTERONE CYPIONATE) 200 MG/ML injection once a week.     thyroid (ARMOUR) 30 MG tablet Take by mouth.     zinc gluconate 50 MG tablet Take by mouth.     Current Facility-Administered Medications  Medication Dose Route Frequency Provider Last Rate Last Admin   0.9 %  sodium chloride infusion  500 mL Intravenous Once Jenel Lucks, MD        REVIEW OF SYSTEMS:    10 Point review of Systems was done is negative except as noted above.   PHYSICAL EXAMINATION: Telephone Visit   LABORATORY DATA:  I have reviewed the data as listed     Latest Ref Rng & Units 05/13/2023   10:58 AM 11/08/2022    1:06 PM 05/08/2022   12:51 PM  CBC  WBC 4.0 - 10.5  K/uL 7.8  8.2  7.3   Hemoglobin 13.0 - 17.0 g/dL 19.1  47.8  29.5   Hematocrit 39.0 - 52.0 % 44.2  41.8  40.5   Platelets 150 - 400 K/uL 303  274  289        Latest Ref Rng & Units 05/13/2023   10:58 AM 11/08/2022    1:06 PM 05/08/2022   12:51 PM  CMP  Glucose 70 - 99 mg/dL 83  78  92   BUN 6 - 20 mg/dL 15  12  13    Creatinine 0.61 - 1.24  mg/dL 1.61  0.96  0.45   Sodium 135 - 145 mmol/L 137  138  138   Potassium 3.5 - 5.1 mmol/L 4.4  4.3  4.0   Chloride 98 - 111 mmol/L 103  103  103   CO2 22 - 32 mmol/L 31  31  31    Calcium 8.9 - 10.3 mg/dL 9.4  9.7  9.3   Total Protein 6.5 - 8.1 g/dL 8.1  7.6  7.8   Total Bilirubin 0.3 - 1.2 mg/dL 1.1  0.9  0.9   Alkaline Phos 38 - 126 U/L 56  56  49   AST 15 - 41 U/L 22  20  18    ALT 0 - 44 U/L 14  12  13     . Lab Results  Component Value Date   LDH 128 05/13/2023     01/23/2021 Surgical Pathology  -Monoclonal B cell population identified  -No aberrant T-cell phenotype identified  -See comment   COMMENT:  Flow cytometric analysis of the lymphoid population representing 19% of  all cells in the sample shows a monoclonal, kappa restricted B-cell  population representing 5% of all cells and expressing B-cell antigens  including CD20 with lack of CD5, CD10, CD25, CD38 or CD103 expression.  The pattern is nonspecific but consistent with involvement by a B-cell  lymphoproliferative process.  Admixed T-cells do not show an aberrant  phenotype.  RADIOGRAPHIC STUDIES: I have personally reviewed the radiological images as listed and agreed with the findings in the report. No results found.   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 CBC and CMP are stable. LDH at 128. -Discussed Multiple myeloma panel results from 05/13/2023, which showed elevated M-protein of 2.1 from 1.5. elevated Globulin at 4.2 as well and elevated IgM levels at 2,945.  -Recommend staying well hydrated, drinking at least 2 L of water.  -Patient has no lab evidence or clinical signs or symptoms of progression of his smoldering Walden Strom's macroglobulinemia at this time. -Advised pt to continue to consume a healthy diet and stay physically active -Discussed with the patient that we will have labs in 3 months instead of 6 months due to elevated M-Protein and elevated IgM.  -Recommended to get in contact with me if he has symptoms such as fever, chills, headaches, dizziness, vision change.  -Discussed with the patient that we might need to get repeat PET scan and repeat bone marrow biopsy if M-protein keeps elevating in 3 months.   FOLLOW UP: Phone visit with Dr Candise Che in 3 months Labs 1 week prior to phone visit    The total time spent in the appointment was 20 minutes* .  All of the patient's questions were answered with apparent satisfaction. The patient knows to call the clinic with any problems, questions or concerns.   Wyvonnia Lora MD MS AAHIVMS [facility] Hematology/Oncology Physician [facility]  .*Total Encounter Time as defined by the Centers for Medicare and Medicaid Services includes, in addition to the face-to-face time of a patient visit (documented in the  note above) non-face-to-face time: obtaining and reviewing outside history, ordering and reviewing medications, tests or procedures, care coordination (communications with other health care professionals or caregivers) and documentation in the medical record.   I,Param Shah,acting as a  Neurosurgeon for Wyvonnia Lora, MD.,have documented all relevant documentation on the behalf of Wyvonnia Lora, MD,as directed by  Wyvonnia Lora, MD while in the presence of Wyvonnia Lora, MD.  .I have reviewed the above documentation for accuracy and completeness, and I agree with the above. Johney Maine MD

## 2023-08-07 ENCOUNTER — Other Ambulatory Visit: Payer: Self-pay

## 2023-08-07 DIAGNOSIS — C8800 Waldenstrom macroglobulinemia not having achieved remission: Secondary | ICD-10-CM

## 2023-08-11 ENCOUNTER — Inpatient Hospital Stay: Payer: Managed Care, Other (non HMO)

## 2023-08-11 ENCOUNTER — Inpatient Hospital Stay: Payer: Managed Care, Other (non HMO) | Attending: Hematology

## 2023-08-11 DIAGNOSIS — C8800 Waldenstrom macroglobulinemia not having achieved remission: Secondary | ICD-10-CM | POA: Insufficient documentation

## 2023-08-11 DIAGNOSIS — Z800 Family history of malignant neoplasm of digestive organs: Secondary | ICD-10-CM | POA: Insufficient documentation

## 2023-08-11 LAB — CBC WITH DIFFERENTIAL (CANCER CENTER ONLY)
Abs Immature Granulocytes: 0.01 10*3/uL (ref 0.00–0.07)
Basophils Absolute: 0.1 10*3/uL (ref 0.0–0.1)
Basophils Relative: 1 %
Eosinophils Absolute: 0.5 10*3/uL (ref 0.0–0.5)
Eosinophils Relative: 6 %
HCT: 39.1 % (ref 39.0–52.0)
Hemoglobin: 13.5 g/dL (ref 13.0–17.0)
Immature Granulocytes: 0 %
Lymphocytes Relative: 22 %
Lymphs Abs: 1.8 10*3/uL (ref 0.7–4.0)
MCH: 32.3 pg (ref 26.0–34.0)
MCHC: 34.5 g/dL (ref 30.0–36.0)
MCV: 93.5 fL (ref 80.0–100.0)
Monocytes Absolute: 0.8 10*3/uL (ref 0.1–1.0)
Monocytes Relative: 9 %
Neutro Abs: 5.1 10*3/uL (ref 1.7–7.7)
Neutrophils Relative %: 62 %
Platelet Count: 321 10*3/uL (ref 150–400)
RBC: 4.18 MIL/uL — ABNORMAL LOW (ref 4.22–5.81)
RDW: 12.1 % (ref 11.5–15.5)
WBC Count: 8.2 10*3/uL (ref 4.0–10.5)
nRBC: 0 % (ref 0.0–0.2)

## 2023-08-11 LAB — CMP (CANCER CENTER ONLY)
ALT: 15 U/L (ref 0–44)
AST: 21 U/L (ref 15–41)
Albumin: 4.3 g/dL (ref 3.5–5.0)
Alkaline Phosphatase: 49 U/L (ref 38–126)
Anion gap: 4 — ABNORMAL LOW (ref 5–15)
BUN: 14 mg/dL (ref 6–20)
CO2: 31 mmol/L (ref 22–32)
Calcium: 9.9 mg/dL (ref 8.9–10.3)
Chloride: 104 mmol/L (ref 98–111)
Creatinine: 1.03 mg/dL (ref 0.61–1.24)
GFR, Estimated: >60 mL/min (ref >60)
Glucose, Bld: 94 mg/dL (ref 70–99)
Potassium: 4 mmol/L (ref 3.5–5.1)
Sodium: 139 mmol/L (ref 135–145)
Total Bilirubin: 0.6 mg/dL (ref <1.2)
Total Protein: 8.4 g/dL — ABNORMAL HIGH (ref 6.5–8.1)

## 2023-08-11 LAB — LACTATE DEHYDROGENASE: LDH: 117 U/L (ref 98–192)

## 2023-08-15 LAB — MULTIPLE MYELOMA PANEL, SERUM
Albumin SerPl Elph-Mcnc: 4 g/dL (ref 2.9–4.4)
Albumin/Glob SerPl: 1.1 (ref 0.7–1.7)
Alpha 1: 0.2 g/dL (ref 0.0–0.4)
Alpha2 Glob SerPl Elph-Mcnc: 0.5 g/dL (ref 0.4–1.0)
B-Globulin SerPl Elph-Mcnc: 0.7 g/dL (ref 0.7–1.3)
Gamma Glob SerPl Elph-Mcnc: 2.4 g/dL — ABNORMAL HIGH (ref 0.4–1.8)
Globulin, Total: 3.7 g/dL (ref 2.2–3.9)
IgA: 111 mg/dL (ref 90–386)
IgG (Immunoglobin G), Serum: 745 mg/dL (ref 603–1613)
IgM (Immunoglobulin M), Srm: 2660 mg/dL — ABNORMAL HIGH (ref 20–172)
M Protein SerPl Elph-Mcnc: 1.5 g/dL — ABNORMAL HIGH
Total Protein ELP: 7.7 g/dL (ref 6.0–8.5)

## 2023-08-19 ENCOUNTER — Inpatient Hospital Stay (HOSPITAL_BASED_OUTPATIENT_CLINIC_OR_DEPARTMENT_OTHER): Payer: Managed Care, Other (non HMO) | Admitting: Hematology

## 2023-08-19 DIAGNOSIS — C8800 Waldenstrom macroglobulinemia not having achieved remission: Secondary | ICD-10-CM | POA: Diagnosis not present

## 2023-08-19 NOTE — Progress Notes (Signed)
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    I connected with Nathaniel Oliver on 08/19/23  at  3:30 PM EST by telephone visit and verified that I am speaking with the correct person using two identifiers.   I last connected with the patient via phone on 05/20/2023 and he was doing well overall.   Patient notes he has been doing well overall since our last visit. He endorsed fatigue couple days after our last phone visit. He had multiple myeloma panel results with his PCP, which showed elevated IgM level. He notes that his fatigue improved without any medication or any other interventions.   During this tele-med visit, patient does complain of mild fatigue, it notes that his fatigue has improved. He does endorse mild lethargy when he goes to the gym. He notes that his fatigue does not affect his daily activities.   He denies any new infection issues, fever, chills, night sweats, unexpected weight loss, unexpected weight loss, back pain, bone pain, chest pain, dizziness, change in vision, headaches, SOB, new lumps/bumps, or leg swelling.   He does have PMHx of asthma.   I discussed the limitations, risks, security and privacy concerns of performing an evaluation and management service by telemedicine and the availability of in-person appointments. I also discussed with the patient that there may be a patient responsible charge related to this service. The patient expressed understanding and agreed to proceed.   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    MEDICAL HISTORY:  Past Medical History:  Diagnosis Date   Anginal pain (HCC)    Chest pain    Dyspnea    Fatigue    GERD (gastroesophageal reflux disease)    Hypothyroidism    SOB (shortness of breath)     SURGICAL HISTORY: Past Surgical History:  Procedure Laterality Date   NO PAST SURGERIES      SOCIAL HISTORY: Social History   Socioeconomic History   Marital status: Married    Spouse name: Not on file   Number of children: Not on file   Years of education: Not on file   Highest education level: Not on file  Occupational History   Not on file  Tobacco Use   Smoking status: Never   Smokeless tobacco: Never  Vaping Use   Vaping status: Never Used  Substance and Sexual Activity   Alcohol use: Not Currently   Drug use: Never   Sexual activity: Not on file  Other Topics Concern   Not on file  Social History Narrative   Not on file   Social Determinants of Health   Financial Resource Strain: Low Risk  (03/28/2023)   Received from [facility]   Overall Financial Resource Strain (CARDIA)    Difficulty of Paying Living Expenses: Not hard at all  Food Insecurity: No Food Insecurity (03/28/2023)   Received from [facility]   Hunger Vital Sign  Worried About Programme researcher, broadcasting/film/video in the Last Year: Never true    Ran Out of Food in the Last Year: Never true  Transportation Needs: No Transportation Needs (03/28/2023)   Received from [facility] - Transportation    Lack of Transportation (Medical): No    Lack of Transportation (Non-Medical): No  Physical Activity: Sufficiently Active (03/28/2023)   Received from [facility]   Exercise Vital Sign    Days of Exercise per Week: 5 days    Minutes of Exercise per Session: 50 min   Stress: No Stress Concern Present (03/28/2023)   Received from [facility] - Occupational Stress Questionnaire    Feeling of Stress : Only a little  Social Connections: Socially Integrated (03/28/2023)   Received from [facility]   Social Network    How would you rate your social network (family, work, friends)?: Good participation with social networks  Intimate Partner Violence: Not At Risk (03/28/2023)   Received from [facility]   HITS    Over the last 12 months how often did your partner physically hurt you?: Never    Over the last 12 months how often did your partner insult you or talk down to you?: Never    Over the last 12 months how often did your partner threaten you with physical harm?: Never    Over the last 12 months how often did your partner scream or curse at you?: Never    FAMILY HISTORY: Family History  Problem Relation Age of Onset   Colon polyps Mother    Hypertension Mother    Stroke Mother    Hyperlipidemia Mother    Pancreatic cancer Father    Healthy Sister    Healthy Brother    Colon cancer Neg Hx     ALLERGIES:  is allergic to fish allergy.  MEDICATIONS:  Current Outpatient Medications  Medication Sig Dispense Refill   Ascorbic Acid (VITAMIN C PO) Take by mouth.     EPINEPHrine 0.3 mg/0.3 mL IJ SOAJ injection Inject 1 Device into the muscle once as needed. Inject into thigh as need for allergic reaction.Seek medical attention after using. (Patient not taking: Reported on 01/03/2021)     Ergocalciferol 50 MCG (2000 UT) CAPS Take by mouth.     Multiple Vitamin (MULTIVITAMIN) tablet Take 1 tablet by mouth daily.     testosterone cypionate (DEPOTESTOSTERONE CYPIONATE) 200 MG/ML injection once a week.     thyroid (ARMOUR) 30 MG tablet Take by mouth.     zinc gluconate 50 MG tablet Take by mouth.     Current Facility-Administered Medications  Medication Dose Route Frequency Provider Last Rate Last Admin    0.9 %  sodium chloride infusion  500 mL Intravenous Once Jenel Lucks, MD        REVIEW OF SYSTEMS:    10 Point review of Systems was done is negative except as noted above.   PHYSICAL EXAMINATION: Telephone Visit   LABORATORY DATA:  I have reviewed the data as listed     Latest Ref Rng & Units 08/11/2023    3:46 PM 05/13/2023   10:58 AM 11/08/2022    1:06 PM  CBC  WBC 4.0 - 10.5 K/uL 8.2  7.8  8.2   Hemoglobin 13.0 - 17.0 g/dL 78.2  95.6  21.3   Hematocrit 39.0 - 52.0 % 39.1  44.2  41.8   Platelets 150 - 400 K/uL 321  303  274        Latest Ref Rng & Units 08/11/2023    3:46 PM 05/13/2023   10:58 AM 11/08/2022    1:06 PM  CMP  Glucose 70 - 99 mg/dL 94  83  78   BUN 6 - 20 mg/dL 14  15  12    Creatinine 0.61 - 1.24 mg/dL 1.61  0.96  0.45   Sodium 135 - 145 mmol/L 139  137  138   Potassium 3.5 - 5.1 mmol/L 4.0  4.4  4.3   Chloride 98 - 111 mmol/L 104  103  103   CO2 22 - 32 mmol/L 31  31  31    Calcium 8.9 - 10.3 mg/dL 9.9  9.4  9.7   Total Protein 6.5 - 8.1 g/dL 8.4  8.1  7.6   Total Bilirubin <1.2 mg/dL 0.6  1.1  0.9   Alkaline Phos 38 - 126 U/L 49  56  56   AST 15 - 41 U/L 21  22  20    ALT 0 - 44 U/L 15  14  12     . Lab Results  Component Value Date   LDH 117 08/11/2023     01/23/2021 Surgical Pathology  -Monoclonal B cell population identified  -No aberrant T-cell phenotype identified  -See comment   COMMENT:  Flow cytometric analysis of the lymphoid population representing 19% of  all cells in the sample shows a monoclonal, kappa restricted B-cell  population representing 5% of all cells and expressing B-cell antigens  including CD20 with lack of CD5, CD10, CD25, CD38 or CD103 expression.  The pattern is nonspecific but consistent with involvement by a B-cell  lymphoproliferative process.  Admixed T-cells do not show an aberrant  phenotype.  RADIOGRAPHIC STUDIES: I have personally reviewed the radiological images as listed and agreed with the findings  in the report. No results found.   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 CBC and CMP are stable. LDH in the normal range at 117.  -Discussed Multiple Myeloma Panel result from 08/11/2023 in detail with the patient. Showed elevated but improved M-protein from 2.1 to 1.5. IgM levels also improved.  -Educated the patient on potential treatment options for smoldering WaldenStrom's macroglobulinemia or MGUS. Patient currently does not need treatment. -Recommend staying well hydrated, drinking at least 2 L of water.  -Patient has no lab evidence or clinical signs or symptoms of progression of his smoldering Walden Strom's macroglobulinemia at this time. -Advised pt to continue to consume a healthy diet and stay physically active -Answered all of patient's questions regarding lab results and other general questions regarding MGUS and smoldering WaldenStrom's macroglobulinemia. -Discussed with the patient that our plan is to continue with observation with labs and we do not need bone marrow biopsy or PET scan right now.   FOLLOW UP: Phone visit with Dr Candise Che in 4 months Labs 1 week prior to phone visit  The total time spent in the appointment was 20 minutes* .  All of the patient's questions were answered  with apparent satisfaction. The patient knows to call the clinic with any problems, questions or concerns.   Wyvonnia Lora MD MS AAHIVMS [facility] Hematology/Oncology Physician [facility]  .*Total Encounter Time as defined by the Centers for Medicare and Medicaid Services includes, in addition to the face-to-face time of a patient visit (documented in the note above) non-face-to-face time: obtaining and reviewing outside history, ordering and reviewing medications, tests or procedures, care coordination (communications with other health care professionals or caregivers) and documentation in the medical record.   I,Param Shah,acting as a Neurosurgeon for Wyvonnia Lora, MD.,have documented all relevant documentation on the behalf of Wyvonnia Lora, MD,as directed by  Wyvonnia Lora, MD while in the presence of Wyvonnia Lora, MD.  .I have reviewed the above documentation for accuracy and completeness, and I agree with the above. Johney Maine MD

## 2023-09-15 ENCOUNTER — Telehealth: Payer: Self-pay | Admitting: Hematology

## 2023-09-15 NOTE — Telephone Encounter (Signed)
 Left patient a vm regarding upcoming appointment

## 2024-01-19 ENCOUNTER — Inpatient Hospital Stay: Payer: Managed Care, Other (non HMO) | Attending: Hematology

## 2024-01-19 ENCOUNTER — Other Ambulatory Visit: Payer: Self-pay

## 2024-01-19 DIAGNOSIS — D472 Monoclonal gammopathy: Secondary | ICD-10-CM | POA: Diagnosis not present

## 2024-01-19 DIAGNOSIS — C8800 Waldenstrom macroglobulinemia not having achieved remission: Secondary | ICD-10-CM | POA: Diagnosis present

## 2024-01-19 LAB — CBC WITH DIFFERENTIAL (CANCER CENTER ONLY)
Abs Immature Granulocytes: 0.01 10*3/uL (ref 0.00–0.07)
Basophils Absolute: 0.1 10*3/uL (ref 0.0–0.1)
Basophils Relative: 1 %
Eosinophils Absolute: 0.3 10*3/uL (ref 0.0–0.5)
Eosinophils Relative: 4 %
HCT: 39.5 % (ref 39.0–52.0)
Hemoglobin: 13.5 g/dL (ref 13.0–17.0)
Immature Granulocytes: 0 %
Lymphocytes Relative: 26 %
Lymphs Abs: 2 10*3/uL (ref 0.7–4.0)
MCH: 31.5 pg (ref 26.0–34.0)
MCHC: 34.2 g/dL (ref 30.0–36.0)
MCV: 92.1 fL (ref 80.0–100.0)
Monocytes Absolute: 0.8 10*3/uL (ref 0.1–1.0)
Monocytes Relative: 10 %
Neutro Abs: 4.5 10*3/uL (ref 1.7–7.7)
Neutrophils Relative %: 59 %
Platelet Count: 282 10*3/uL (ref 150–400)
RBC: 4.29 MIL/uL (ref 4.22–5.81)
RDW: 12.2 % (ref 11.5–15.5)
WBC Count: 7.7 10*3/uL (ref 4.0–10.5)
nRBC: 0 % (ref 0.0–0.2)

## 2024-01-19 LAB — CMP (CANCER CENTER ONLY)
ALT: 14 U/L (ref 0–44)
AST: 20 U/L (ref 15–41)
Albumin: 4.3 g/dL (ref 3.5–5.0)
Alkaline Phosphatase: 52 U/L (ref 38–126)
Anion gap: 3 — ABNORMAL LOW (ref 5–15)
BUN: 13 mg/dL (ref 6–20)
CO2: 31 mmol/L (ref 22–32)
Calcium: 9.9 mg/dL (ref 8.9–10.3)
Chloride: 104 mmol/L (ref 98–111)
Creatinine: 1.09 mg/dL (ref 0.61–1.24)
GFR, Estimated: >60 mL/min (ref >60)
Glucose, Bld: 80 mg/dL (ref 70–99)
Potassium: 4.3 mmol/L (ref 3.5–5.1)
Sodium: 138 mmol/L (ref 135–145)
Total Bilirubin: 0.6 mg/dL (ref 0.0–1.2)
Total Protein: 8.2 g/dL — ABNORMAL HIGH (ref 6.5–8.1)

## 2024-01-19 LAB — LACTATE DEHYDROGENASE: LDH: 145 U/L (ref 98–192)

## 2024-01-21 LAB — MULTIPLE MYELOMA PANEL, SERUM
Albumin SerPl Elph-Mcnc: 3.9 g/dL (ref 2.9–4.4)
Albumin/Glob SerPl: 1 (ref 0.7–1.7)
Alpha 1: 0.2 g/dL (ref 0.0–0.4)
Alpha2 Glob SerPl Elph-Mcnc: 0.5 g/dL (ref 0.4–1.0)
B-Globulin SerPl Elph-Mcnc: 0.8 g/dL (ref 0.7–1.3)
Gamma Glob SerPl Elph-Mcnc: 2.6 g/dL — ABNORMAL HIGH (ref 0.4–1.8)
Globulin, Total: 4.2 g/dL — ABNORMAL HIGH (ref 2.2–3.9)
IgA: 105 mg/dL (ref 90–386)
IgG (Immunoglobin G), Serum: 762 mg/dL (ref 603–1613)
IgM (Immunoglobulin M), Srm: 2735 mg/dL — ABNORMAL HIGH (ref 20–172)
M Protein SerPl Elph-Mcnc: 2.1 g/dL — ABNORMAL HIGH
Total Protein ELP: 8.1 g/dL (ref 6.0–8.5)

## 2024-01-27 ENCOUNTER — Inpatient Hospital Stay (HOSPITAL_BASED_OUTPATIENT_CLINIC_OR_DEPARTMENT_OTHER): Payer: Managed Care, Other (non HMO) | Admitting: Hematology

## 2024-01-27 DIAGNOSIS — C8800 Waldenstrom macroglobulinemia not having achieved remission: Secondary | ICD-10-CM | POA: Diagnosis not present

## 2024-01-27 NOTE — Progress Notes (Signed)
 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    I connected with Nathaniel Oliver on 01/27/24  at  3:30 PM EDT by telephone visit and verified that I am speaking with the correct person using two identifiers.   I last connected with the patient on 08/19/2023 and he complained of mild occasional fatigue/lethargy.   Patient notes he has been doing well overall since our last visit. He notes that his energy has improved since our last visit. However, he does report occasional mild lethargy.   He denies any new infection issues, fever, chills, night sweats, unexpected weight loss, bone pain, back pain, SOB, vision change, severe headache, chest pain, abdominal pain, new lump/bump, or leg swelling. He does report occasional mild headaches.   Discussed lab results from 01/19/2024 in detail with the patient.   I discussed the limitations, risks, security and privacy concerns of performing an evaluation and management service by telemedicine and the availability of in-person appointments. I also discussed with the patient that there may be a patient responsible charge related to this service. The patient expressed understanding and agreed to proceed.   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    MEDICAL HISTORY:  Past Medical History:  Diagnosis  Date   Anginal pain (HCC)    Chest pain    Dyspnea    Fatigue    GERD (gastroesophageal reflux disease)    Hypothyroidism    SOB (shortness of breath)     SURGICAL HISTORY: Past Surgical History:  Procedure Laterality Date   NO PAST SURGERIES      SOCIAL HISTORY: Social History   Socioeconomic History   Marital status: Married    Spouse name: Not on file   Number of children: Not on file   Years of education: Not on file   Highest education level: Not on file  Occupational History   Not on file  Tobacco Use   Smoking status: Never   Smokeless tobacco: Never  Vaping Use   Vaping status: Never Used  Substance and Sexual Activity   Alcohol use: Not Currently   Drug use: Never   Sexual activity: Not on file  Other Topics Concern   Not on file  Social History Narrative   Not on file   Social Drivers of Health   Financial Resource Strain: Low Risk  (03/28/2023)   Received from [facility]   Overall Financial Resource Strain (CARDIA)    Difficulty of Paying Living Expenses: Not hard at all  Food Insecurity: No Food Insecurity (03/28/2023)   Received from [facility]   Hunger Vital Sign    Worried About Running Out of Food in the Last Year: Never true    Ran Out of Food in the Last Year: Never true  Transportation Needs: No Transportation Needs (03/28/2023)   Received from [facility] - Transportation    Lack of Transportation (Medical): No  Lack of Transportation (Non-Medical): No  Physical Activity: Sufficiently Active (03/28/2023)   Received from [facility]   Exercise Vital Sign    Days of Exercise per Week: 5 days    Minutes of Exercise per Session: 50 min  Stress: No Stress Concern Present (03/28/2023)   Received from [facility] - Occupational Stress Questionnaire    Feeling of Stress : Only a little  Social Connections: Socially Integrated (03/28/2023)   Received from [facility]   Social  Network    How would you rate your social network (family, work, friends)?: Good participation with social networks  Intimate Partner Violence: Not At Risk (03/28/2023)   Received from [facility]   HITS    Over the last 12 months how often did your partner physically hurt you?: Never    Over the last 12 months how often did your partner insult you or talk down to you?: Never    Over the last 12 months how often did your partner threaten you with physical harm?: Never    Over the last 12 months how often did your partner scream or curse at you?: Never    FAMILY HISTORY: Family History  Problem Relation Age of Onset   Colon polyps Mother    Hypertension Mother    Stroke Mother    Hyperlipidemia Mother    Pancreatic cancer Father    Healthy Sister    Healthy Brother    Colon cancer Neg Hx     ALLERGIES:  is allergic to fish allergy.  MEDICATIONS:  Current Outpatient Medications  Medication Sig Dispense Refill   Ascorbic Acid (VITAMIN C PO) Take by mouth.     EPINEPHrine 0.3 mg/0.3 mL IJ SOAJ injection Inject 1 Device into the muscle once as needed. Inject into thigh as need for allergic reaction.Seek medical attention after using. (Patient not taking: Reported on 01/03/2021)     Ergocalciferol 50 MCG (2000 UT) CAPS Take by mouth.     Multiple Vitamin (MULTIVITAMIN) tablet Take 1 tablet by mouth daily.     testosterone cypionate (DEPOTESTOSTERONE CYPIONATE) 200 MG/ML injection once a week.     thyroid (ARMOUR) 30 MG tablet Take by mouth.     zinc gluconate 50 MG tablet Take by mouth.     Current Facility-Administered Medications  Medication Dose Route Frequency Provider Last Rate Last Admin   0.9 %  sodium chloride  infusion  500 mL Intravenous Once Elois Hair, MD        REVIEW OF SYSTEMS:    10 Point review of Systems was done is negative except as noted above.   PHYSICAL EXAMINATION: Telephone Visit   LABORATORY DATA:  I have reviewed the data as listed      Latest Ref Rng & Units 01/19/2024    4:06 PM 08/11/2023    3:46 PM 05/13/2023   10:58 AM  CBC  WBC 4.0 - 10.5 K/uL 7.7  8.2  7.8   Hemoglobin 13.0 - 17.0 g/dL 16.1  09.6  04.5   Hematocrit 39.0 - 52.0 % 39.5  39.1  44.2   Platelets 150 - 400 K/uL 282  321  303        Latest Ref Rng & Units 01/19/2024    4:06 PM 08/11/2023    3:46 PM 05/13/2023   10:58 AM  CMP  Glucose 70 - 99 mg/dL 80  94  83   BUN 6 - 20 mg/dL 13  14  15   Creatinine 0.61 - 1.24 mg/dL 4.53  6.46  8.03   Sodium 135 - 145 mmol/L 138  139  137   Potassium 3.5 - 5.1 mmol/L 4.3  4.0  4.4   Chloride 98 - 111 mmol/L 104  104  103   CO2 22 - 32 mmol/L 31  31  31    Calcium 8.9 - 10.3 mg/dL 9.9  9.9  9.4   Total Protein 6.5 - 8.1 g/dL 8.2  8.4  8.1   Total Bilirubin 0.0 - 1.2 mg/dL 0.6  0.6  1.1   Alkaline Phos 38 - 126 U/L 52  49  56   AST 15 - 41 U/L 20  21  22    ALT 0 - 44 U/L 14  15  14     . Lab Results  Component Value Date   LDH 145 01/19/2024     01/23/2021 Surgical Pathology  -Monoclonal B cell population identified  -No aberrant T-cell phenotype identified  -See comment   COMMENT:  Flow cytometric analysis of the lymphoid population representing 19% of  all cells in the sample shows a monoclonal, kappa restricted B-cell  population representing 5% of all cells and expressing B-cell antigens  including CD20 with lack of CD5, CD10, CD25, CD38 or CD103 expression.  The pattern is nonspecific but consistent with involvement by a B-cell  lymphoproliferative process.  Admixed T-cells do not show an aberrant  phenotype.  RADIOGRAPHIC STUDIES: I have personally reviewed the radiological images as listed and agreed with the findings in the report. No results found.   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 CBC and CMP stable. LDH of 145.  -Discussed Multiple myeloma panel results from 01/19/2024 in detail with the patient. Showed elevated M-Protein of 2.1.  -Patient has no lab evidence or clinical signs or symptoms of progression of his smoldering Walden Strom's macroglobulinemia at this time.  -No need to start treatment.  -No need for repeat bone marrow.  -Recommend staying well hydrated, drinking at least 2 L of water.  -Advised pt to continue to consume a healthy diet and stay physically active -Answered all of patient's questions regarding lab results and other general questions regarding MGUS and smoldering WaldenStrom's macroglobulinemia.  FOLLOW UP: Phone visit with Dr Salomon Cree in 4 months Labs 1 week prior to phone visit.   The total time spent in the appointment was 20 minutes* .  All of the patient's questions were answered with apparent satisfaction. The patient knows to call the clinic with any problems, questions or concerns.   Jacquelyn Matt MD MS AAHIVMS [facility] Hematology/Oncology Physician [facility]  .*Total Encounter Time as defined by the Centers for Medicare and Medicaid Services includes, in addition to the face-to-face time of a patient visit (documented in the note above) non-face-to-face time: obtaining and reviewing outside history, ordering and reviewing medications, tests or procedures, care coordination (communications with other health care professionals or caregivers) and documentation in the medical record.   I,Param Shah,acting as a Neurosurgeon  for Jacquelyn Matt, MD.,have documented all relevant documentation on the behalf of Jacquelyn Matt, MD,as directed by   Jacquelyn Matt, MD while in the presence of Jacquelyn Matt, MD.  .I have reviewed the above documentation for accuracy and completeness, and I agree with the above. .Sherrell Farish Kishore Davena Julian MD

## 2024-01-28 ENCOUNTER — Telehealth: Payer: Self-pay | Admitting: Hematology

## 2024-01-28 NOTE — Telephone Encounter (Signed)
 Left patient a vm regarding upcoming appointment

## 2024-05-21 ENCOUNTER — Other Ambulatory Visit: Payer: Self-pay

## 2024-05-21 DIAGNOSIS — C8800 Waldenstrom macroglobulinemia not having achieved remission: Secondary | ICD-10-CM

## 2024-05-24 ENCOUNTER — Inpatient Hospital Stay: Attending: Hematology

## 2024-05-24 DIAGNOSIS — C8800 Waldenstrom macroglobulinemia not having achieved remission: Secondary | ICD-10-CM | POA: Diagnosis present

## 2024-05-24 LAB — CBC WITH DIFFERENTIAL (CANCER CENTER ONLY)
Abs Immature Granulocytes: 0.02 K/uL (ref 0.00–0.07)
Basophils Absolute: 0.1 K/uL (ref 0.0–0.1)
Basophils Relative: 1 %
Eosinophils Absolute: 0.1 K/uL (ref 0.0–0.5)
Eosinophils Relative: 2 %
HCT: 39.1 % (ref 39.0–52.0)
Hemoglobin: 13.3 g/dL (ref 13.0–17.0)
Immature Granulocytes: 0 %
Lymphocytes Relative: 19 %
Lymphs Abs: 1.7 K/uL (ref 0.7–4.0)
MCH: 31.9 pg (ref 26.0–34.0)
MCHC: 34 g/dL (ref 30.0–36.0)
MCV: 93.8 fL (ref 80.0–100.0)
Monocytes Absolute: 0.8 K/uL (ref 0.1–1.0)
Monocytes Relative: 9 %
Neutro Abs: 6.1 K/uL (ref 1.7–7.7)
Neutrophils Relative %: 69 %
Platelet Count: 355 K/uL (ref 150–400)
RBC: 4.17 MIL/uL — ABNORMAL LOW (ref 4.22–5.81)
RDW: 12.2 % (ref 11.5–15.5)
WBC Count: 8.8 K/uL (ref 4.0–10.5)
nRBC: 0 % (ref 0.0–0.2)

## 2024-05-24 LAB — CMP (CANCER CENTER ONLY)
ALT: 13 U/L (ref 0–44)
AST: 17 U/L (ref 15–41)
Albumin: 4.3 g/dL (ref 3.5–5.0)
Alkaline Phosphatase: 60 U/L (ref 38–126)
Anion gap: 5 (ref 5–15)
BUN: 11 mg/dL (ref 6–20)
CO2: 30 mmol/L (ref 22–32)
Calcium: 9.7 mg/dL (ref 8.9–10.3)
Chloride: 100 mmol/L (ref 98–111)
Creatinine: 1.11 mg/dL (ref 0.61–1.24)
GFR, Estimated: >60 mL/min (ref >60)
Glucose, Bld: 104 mg/dL — ABNORMAL HIGH (ref 70–99)
Potassium: 4.1 mmol/L (ref 3.5–5.1)
Sodium: 135 mmol/L (ref 135–145)
Total Bilirubin: 0.7 mg/dL (ref 0.0–1.2)
Total Protein: 8.1 g/dL (ref 6.5–8.1)

## 2024-05-24 LAB — LACTATE DEHYDROGENASE: LDH: 96 U/L — ABNORMAL LOW (ref 98–192)

## 2024-05-31 ENCOUNTER — Inpatient Hospital Stay (HOSPITAL_BASED_OUTPATIENT_CLINIC_OR_DEPARTMENT_OTHER): Admitting: Hematology

## 2024-05-31 DIAGNOSIS — C8800 Waldenstrom macroglobulinemia not having achieved remission: Secondary | ICD-10-CM | POA: Diagnosis not present

## 2024-06-02 ENCOUNTER — Inpatient Hospital Stay

## 2024-06-08 NOTE — Progress Notes (Signed)
 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    I connected with [name] on.05/31/2024 at  3:30 PM EDT by telephone visit and verified that I am speaking with the correct person using two identifiers.   I discussed the limitations, risks, security and privacy concerns of performing an evaluation and management service by telemedicine and the availability of in-person appointments. I also discussed with the patient that there may be a patient responsible charge related to this service. The patient expressed understanding and agreed to proceed.   Other persons participating in the visit and their role in the encounter: none   Patient's location: home  Provider's location: [facility]   Chief Complaint: Continued evaluation and management of smoldering Nathaniel Oliver's macroglobulinemia  I last connected with the patient about 4 months ago.  Patient notes no acute new symptoms since his last phone visit.  No fevers no chills no night sweats no unexpected weight loss.  No new focal bone pains.  No new shortness of breath or chest pain.  No new visual changes.  No abdominal pain or distention.  No new leg swelling. Labs done on 05/24/2024 were discussed with him in details.  MEDICAL HISTORY:  Past Medical History:  Diagnosis Date   Anginal pain (HCC)    Chest pain    Dyspnea    Fatigue    GERD (gastroesophageal reflux disease)    Hypothyroidism    SOB (shortness of breath)     SURGICAL HISTORY: Past Surgical History:  Procedure  Laterality Date   NO PAST SURGERIES      SOCIAL HISTORY: Social History   Socioeconomic History   Marital status: Married    Spouse name: Not on file   Number of children: Not on file   Years of education: Not on file   Highest education level: Not on file  Occupational History   Not on file  Tobacco Use   Smoking status: Never   Smokeless tobacco: Never  Vaping Use   Vaping status: Never Used  Substance and Sexual Activity   Alcohol use: Not Currently   Drug use: Never   Sexual activity: Not on file  Other Topics Concern   Not on file  Social History Narrative   Not on file   Social Drivers of Health   Financial Resource Strain: Low Risk  (03/28/2023)   Received from [facility]   Overall Financial Resource Strain (CARDIA)    Difficulty of Paying Living Expenses: Not hard at all  Food Insecurity: No Food Insecurity (03/28/2023)   Received from [facility]   Hunger Vital Sign    Within the past 12 months, you worried that your food would run out before you got the money to buy more.: Never true    Within the past 12 months, the food you bought just didn't last and you didn't have money to get more.: Never true  Transportation Needs: No Transportation Needs (03/28/2023)   Received from [facility] - Transportation    Lack of Transportation (Medical): No    Lack of Transportation (Non-Medical): No  Physical Activity:  Sufficiently Active (03/28/2023)   Received from [facility]   Exercise Vital Sign    On average, how many days per week do you engage in moderate to strenuous exercise (like a brisk walk)?: 5 days    On average, how many minutes do you engage in exercise at this level?: 50 min  Stress: No Stress Concern Present (03/28/2023)   Received from [facility] - Occupational Stress Questionnaire    Feeling of Stress : Only a little  Social Connections: Socially Integrated (03/28/2023)   Received from  [facility]   Social Network    How would you rate your social network (family, work, friends)?: Good participation with social networks  Intimate Partner Violence: Not At Risk (03/28/2023)   Received from [facility]   HITS    Over the last 12 months how often did your partner physically hurt you?: Never    Over the last 12 months how often did your partner insult you or talk down to you?: Never    Over the last 12 months how often did your partner threaten you with physical harm?: Never    Over the last 12 months how often did your partner scream or curse at you?: Never    FAMILY HISTORY: Family History  Problem Relation Age of Onset   Colon polyps Mother    Hypertension Mother    Stroke Mother    Hyperlipidemia Mother    Pancreatic cancer Father    Healthy Sister    Healthy Brother    Colon cancer Neg Hx     ALLERGIES:  is allergic to fish allergy.  MEDICATIONS:  Current Outpatient Medications  Medication Sig Dispense Refill   Ascorbic Acid (VITAMIN C PO) Take by mouth.     EPINEPHrine 0.3 mg/0.3 mL IJ SOAJ injection Inject 1 Device into the muscle once as needed. Inject into thigh as need for allergic reaction.Seek medical attention after using. (Patient not taking: Reported on 01/03/2021)     Ergocalciferol 50 MCG (2000 UT) CAPS Take by mouth.     Multiple Vitamin (MULTIVITAMIN) tablet Take 1 tablet by mouth daily.     testosterone cypionate (DEPOTESTOSTERONE CYPIONATE) 200 MG/ML injection once a week.     thyroid (ARMOUR) 30 MG tablet Take by mouth.     zinc gluconate 50 MG tablet Take by mouth.     Current Facility-Administered Medications  Medication Dose Route Frequency Provider Last Rate Last Admin   0.9 %  sodium chloride  infusion  500 mL Intravenous Once Stacia Glendia BRAVO, MD        REVIEW OF SYSTEMS:    .10 Point review of Systems was done is negative except as noted above.   PHYSICAL EXAMINATION: Telephone Visit   LABORATORY DATA:  I have reviewed  the data as listed     Latest Ref Rng & Units 05/24/2024    3:47 PM 01/19/2024    4:06 PM 08/11/2023    3:46 PM  CBC  WBC 4.0 - 10.5 K/uL 8.8  7.7  8.2   Hemoglobin 13.0 - 17.0 g/dL 86.6  86.4  86.4   Hematocrit 39.0 - 52.0 % 39.1  39.5  39.1   Platelets 150 - 400 K/uL 355  282  321        Latest Ref Rng & Units 05/24/2024    3:47 PM 01/19/2024    4:06 PM 08/11/2023    3:46 PM  CMP  Glucose 70 -  99 mg/dL 895  80  94   BUN 6 - 20 mg/dL 11  13  14    Creatinine 0.61 - 1.24 mg/dL 8.88  8.90  8.96   Sodium 135 - 145 mmol/L 135  138  139   Potassium 3.5 - 5.1 mmol/L 4.1  4.3  4.0   Chloride 98 - 111 mmol/L 100  104  104   CO2 22 - 32 mmol/L 30  31  31    Calcium 8.9 - 10.3 mg/dL 9.7  9.9  9.9   Total Protein 6.5 - 8.1 g/dL 8.1  8.2  8.4   Total Bilirubin 0.0 - 1.2 mg/dL 0.7  0.6  0.6   Alkaline Phos 38 - 126 U/L 60  52  49   AST 15 - 41 U/L 17  20  21    ALT 0 - 44 U/L 13  14  15     . Lab Results  Component Value Date   LDH 96 (L) 05/24/2024     01/23/2021 Surgical Pathology  -Monoclonal B cell population identified  -No aberrant T-cell phenotype identified  -See comment   COMMENT:  Flow cytometric analysis of the lymphoid population representing 19% of  all cells in the sample shows a monoclonal, kappa restricted B-cell  population representing 5% of all cells and expressing B-cell antigens  including CD20 with lack of CD5, CD10, CD25, CD38 or CD103 expression.  The pattern is nonspecific but consistent with involvement by a B-cell  lymphoproliferative process.  Admixed T-cells do not show an aberrant  phenotype.  RADIOGRAPHIC STUDIES: I have personally reviewed the radiological images as listed and agreed with the findings in the report. No results found.   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  CBC stable with normal WBC count of 8.8k, hemoglobin of 13.3 and platelets of 355k CMP within normal limits LDH within normal limits. Myeloma panel not sent currently pending. Patient notes no new symptoms or lab findings suggestive of progression of his smoldering Waldenstrm's macroglobulinemia. No neuropathy.  No constitutional symptoms.  No known new cytopenias. He was recommended to continue staying physically active and drinking at least 2 L of water daily No symptomatic progression to justify initiating treatment at this time FOLLOW UP: Labs appointment Wednesday 8/27 after for myeloma panel Phone visit with Dr Onesimo in 4 months Labs 1 week prior to phone visit.   The total time spent in the appointment was 15 minutes*.  All of the patient's questions were answered with apparent satisfaction. The patient knows to call the clinic with any problems, questions or concerns.   Emaline Onesimo MD MS AAHIVMS [facility] Hematology/Oncology Physician [facility]  .*Total Encounter Time as defined by the Centers for Medicare and Medicaid Services includes, in addition to the face-to-face time of a patient visit (documented in the note above) non-face-to-face time: obtaining and reviewing outside history, ordering and reviewing medications, tests or procedures, care coordination (communications with other health care professionals or caregivers) and documentation in the medical record.

## 2024-08-11 ENCOUNTER — Ambulatory Visit

## 2024-08-12 ENCOUNTER — Encounter: Payer: Self-pay | Admitting: Family Medicine

## 2024-08-12 ENCOUNTER — Ambulatory Visit (INDEPENDENT_AMBULATORY_CARE_PROVIDER_SITE_OTHER): Admitting: Family Medicine

## 2024-08-12 ENCOUNTER — Other Ambulatory Visit: Payer: Self-pay

## 2024-08-12 VITALS — BP 104/72 | Ht 70.0 in | Wt 165.0 lb

## 2024-08-12 DIAGNOSIS — S46212A Strain of muscle, fascia and tendon of other parts of biceps, left arm, initial encounter: Secondary | ICD-10-CM | POA: Diagnosis not present

## 2024-08-12 DIAGNOSIS — M25522 Pain in left elbow: Secondary | ICD-10-CM

## 2024-08-12 NOTE — Progress Notes (Signed)
 PCP: Arby Lyle LABOR, NP  Patient is a 58 y.o. male here for new left bicep injury. RHD  HPI 2 days ago (11/4) patient was lowering a heavy box to the ground with his left arm underneath, when his arm gave out and fully extended at the elbow.  At the same time he felt some pain and heard several loud pops.  He immediately noticed a bulge in his mid upper arm and felt that the area of his inner elbow looked hollow.  He also had some initial numbness/tingling to the left pinky finger, though this is now resolved. Since then he has noted some swelling around his elbow as well as mild bruising in the inner elbow.  He has felt weaker when trying to use his left arm. He has never had an injury like this before.  No history of anabolic steroids, recent quinolone antibiotic use, or other possible contributory factors. He does lift weights regularly and would like to be able to continue. He is right-handed, works with arts administrator.  Past Medical History:  Diagnosis Date   Anginal pain    Chest pain    Dyspnea    Fatigue    GERD (gastroesophageal reflux disease)    Hypothyroidism    SOB (shortness of breath)     Current Outpatient Medications on File Prior to Visit  Medication Sig Dispense Refill   Ascorbic Acid (VITAMIN C PO) Take by mouth.     EPINEPHrine 0.3 mg/0.3 mL IJ SOAJ injection Inject 1 Device into the muscle once as needed. Inject into thigh as need for allergic reaction.Seek medical attention after using. (Patient not taking: Reported on 01/03/2021)     Ergocalciferol 50 MCG (2000 UT) CAPS Take by mouth.     Multiple Vitamin (MULTIVITAMIN) tablet Take 1 tablet by mouth daily.     testosterone cypionate (DEPOTESTOSTERONE CYPIONATE) 200 MG/ML injection once a week.     thyroid (ARMOUR) 30 MG tablet Take by mouth.     zinc gluconate 50 MG tablet Take by mouth.     Current Facility-Administered Medications on File Prior to Visit  Medication Dose Route Frequency Provider Last Rate  Last Admin   0.9 %  sodium chloride  infusion  500 mL Intravenous Once Stacia Glendia BRAVO, MD        Past Surgical History:  Procedure Laterality Date   NO PAST SURGERIES      Allergies  Allergen Reactions   Fish Allergy Swelling    BP 104/72   Ht 5' 10 (1.778 m)   Wt 165 lb (74.8 kg)   BMI 23.68 kg/m       No data to display              No data to display              Objective:  Physical Exam: Gen: NAD, comfortable in exam room  Left elbow Inspection: There is moderate swelling and mild ecchymosis. Visible defect noted along the distal bicep Palpation: Tenderness to palpation over the medial and anterior elbow. ROM: Full range of motion intact. Strength: Grip strength intact bilaterally.  Strength deficit with flexion of left elbow - 4-/5 with pain  Left upper arm Noticeable deformity of musculature as compared to the right side; there is bulging of the biceps musculature distal third of the upper arm. Positive hook test on the left side. Neurovascularly intact distally.    Assessment and Plan:  Left arm/elbow pain - Given the mechanism of injury  and physical exam I am suspicious for distal biceps tendon rupture.  Ultrasound performed in office today does show disruption of the left distal biceps tendon as compared to the right side, confirming the suspicion. - Discussed with patient the course of this type of injury, including that permanent weakness could result without treatment, likely surgical repair. - MRI ordered and patient referred for surgical consult. - Precautions discussed.  He may use a sling if he finds this helpful.

## 2024-08-16 ENCOUNTER — Other Ambulatory Visit: Payer: Self-pay

## 2024-08-16 ENCOUNTER — Encounter (HOSPITAL_BASED_OUTPATIENT_CLINIC_OR_DEPARTMENT_OTHER): Payer: Self-pay | Admitting: Orthopaedic Surgery

## 2024-08-18 NOTE — H&P (Signed)
 PREOPERATIVE H&P  Chief Complaint: LEFT BICEPS TENDON TEAR  HPI: Nathaniel Oliver is a 58 y.o. male who is scheduled for, Procedure(s): REPAIR, TENDON, BICEPS, DISTAL.   A 58 year old male presents for evaluation of a left distal bicep rupture. The patient reports a recent injury to his left arm, resulting in a noticeable deformity and loss of function. He describes difficulty with supination and flexion of the left arm, impacting his ability to perform daily activities such as turning a doorknob or using a screwdriver. The injury occurred approximately one week ago. The patient has not undergone any prior surgical interventions for this condition. He is active and wishes to regain full function of his arm.  Symptoms are rated as moderate to severe, and have been worsening.  This is significantly impairing activities of daily living.    Please see clinic note for further details on this patient's care.    He has elected for surgical management.   Past Medical History:  Diagnosis Date   Anginal pain    Chest pain    Dyspnea    Fatigue    GERD (gastroesophageal reflux disease)    Hypothyroidism    SOB (shortness of breath)    Past Surgical History:  Procedure Laterality Date   NO PAST SURGERIES     Social History   Socioeconomic History   Marital status: Married    Spouse name: Not on file   Number of children: Not on file   Years of education: Not on file   Highest education level: Not on file  Occupational History   Not on file  Tobacco Use   Smoking status: Never   Smokeless tobacco: Never  Vaping Use   Vaping status: Never Used  Substance and Sexual Activity   Alcohol use: Not Currently   Drug use: Never   Sexual activity: Not on file  Other Topics Concern   Not on file  Social History Narrative   Not on file   Social Drivers of Health   Financial Resource Strain: Low Risk  (03/28/2023)   Received from [facility]   Overall Financial Resource Strain  (CARDIA)    Difficulty of Paying Living Expenses: Not hard at all  Food Insecurity: No Food Insecurity (03/28/2023)   Received from [facility]   Hunger Vital Sign    Within the past 12 months, you worried that your food would run out before you got the money to buy more.: Never true    Within the past 12 months, the food you bought just didn't last and you didn't have money to get more.: Never true  Transportation Needs: No Transportation Needs (03/28/2023)   Received from [facility] - Transportation    Lack of Transportation (Medical): No    Lack of Transportation (Non-Medical): No  Physical Activity: Sufficiently Active (03/28/2023)   Received from [facility]   Exercise Vital Sign    On average, how many days per week do you engage in moderate to strenuous exercise (like a brisk walk)?: 5 days    On average, how many minutes do you engage in exercise at this level?: 50 min  Stress: No Stress Concern Present (03/28/2023)   Received from [facility] - Occupational Stress Questionnaire    Feeling of Stress : Only a little  Social Connections: Socially Integrated (03/28/2023)   Received from [facility]   Social Network    How would you rate  your social network (family, work, friends)?: Good participation with social networks   Family History  Problem Relation Age of Onset   Colon polyps Mother    Hypertension Mother    Stroke Mother    Hyperlipidemia Mother    Pancreatic cancer Father    Healthy Sister    Healthy Brother    Colon cancer Neg Hx    Allergies  Allergen Reactions   Fish Allergy Swelling   Prior to Admission medications   Medication Sig Start Date End Date Taking? Authorizing Provider  thyroid (ARMOUR) 30 MG tablet Take by mouth. 04/21/19  Yes [provider]  Ascorbic Acid (VITAMIN C PO) Take by mouth.    [provider]  EPINEPHrine 0.3 mg/0.3 mL IJ SOAJ injection Inject 1 Device  into the muscle once as needed. Inject into thigh as need for allergic reaction.Seek medical attention after using. Patient not taking: Reported on 01/03/2021 01/24/15   [provider]  Ergocalciferol 50 MCG (2000 UT) CAPS Take by mouth.    [provider]  Multiple Vitamin (MULTIVITAMIN) tablet Take 1 tablet by mouth daily.    [provider]  testosterone cypionate (DEPOTESTOSTERONE CYPIONATE) 200 MG/ML injection once a week. 10/01/20   [provider]  zinc gluconate 50 MG tablet Take by mouth.    [provider]    ROS: All other systems have been reviewed and were otherwise negative with the exception of those mentioned in the HPI and as above.  Physical Exam: General: Alert, no acute distress Cardiovascular: No pedal edema Respiratory: No cyanosis, no use of accessory musculature GI: No organomegaly, abdomen is soft and non-tender Skin: No lesions in the area of chief complaint Neurologic: Sensation intact distally Psychiatric: Patient is competent for consent with normal mood and affect Lymphatic: No axillary or cervical lymphadenopathy  MUSCULOSKELETAL:  On examination, the patient is a well-developed, well-nourished male in no acute distress. Examination of the left arm reveals a visible deformity consistent with a distal bicep tear. There is tenderness over the distal biceps region. Range of motion is limited in the left elbow, particularly in supination and flexion. Strength testing shows decreased strength in the left bicep at 4/5 compared to the right. Neurovascular status is intact, with no signs of nerve or vessel injury. There is no significant swelling or bruising noted.   Imaging: MRI demonstrates a complete tear of the distal biceps retraction and folding the tendon on itself.   Assessment: LEFT BICEPS TENDON TEAR  Plan: Plan for Procedure(s): REPAIR, TENDON, BICEPS, DISTAL  The risks benefits and alternatives were  discussed with the patient including but not limited to the risks of nonoperative treatment, versus surgical intervention including infection, bleeding, nerve injury,  blood clots, cardiopulmonary complications, morbidity, mortality, among others, and they were willing to proceed.   The patient acknowledged the explanation, agreed to proceed with the plan and consent was signed.   Operative Plan: left distal biceps tendon repair  Discharge Medications: standard DVT Prophylaxis: none Physical Therapy: outpatient Special Discharge needs: Splint. Sling   Aleck LOISE Stalling, PA-C  08/18/2024 11:51 AM

## 2024-08-18 NOTE — Discharge Instructions (Addendum)
 Bonner Hair MD, MPH Aleck Stalling, PA-C [facility] [address], [address] [phone] (tel)   [phone] (fax)   POST-OPERATIVE INSTRUCTIONS   WOUND CARE Please keep splint clean dry and intact until followup.  You may shower on Post-Op Day #3.  You must keep splint dry during this process and may find that a plastic bag taped around the extremity or alternatively a towel based bath may be a better option.   If you get your splint wet or if it is damaged please contact our clinic.  EXERCISES Due to your splint being in place you will not be able to bear weight through your extremity.    You may use a sling for comfort   It is normal for your fingers/hand to become more swollen after surgery and discolored from bruising.   This will resolve over the first few weeks usually after surgery. Please continue to ambulate and do not stay sitting or lying for too long.  Perform foot and wrist pumps to assist in circulation.   REGIONAL ANESTHESIA (NERVE BLOCKS) The anesthesia team may have performed a nerve block for you this is a great tool used to minimize pain.   The block may start wearing off overnight (between 8-24 hours postop) When the block wears off, your pain may go from nearly zero to the pain you would have had postop without the block. This is an abrupt transition but nothing dangerous is happening.   This can be a challenging period but utilize your as needed pain medications to try and manage this period. We suggest you use the pain medication the first night prior to going to bed, to ease this transition.  You may take an extra dose of narcotic when this happens if needed   POST-OP MEDICATIONS- Multimodal approach to pain control In general your pain will be controlled with a combination of substances.  Prescriptions unless otherwise discussed are electronically sent to your pharmacy.  This is a carefully made plan we use to minimize  narcotic use.     Celebrex - Anti-inflammatory medication taken on a scheduled basis Acetaminophen - Non-narcotic pain medicine taken on a scheduled basis  Oxycodone - This is a strong narcotic, to be used only on an "as needed" basis for SEVERE pain. Zofran - take as needed for nausea   FOLLOW-UP If you develop a Fever (>101.5), Redness or Drainage from the surgical incision site, please call our office to arrange for an evaluation. Please call the office to schedule a follow-up appointment for your incision check if you do not already have one, 7-10 days post-operatively.   HELPFUL INFORMATION    You may be more comfortable sleeping in a semi-seated position the first few nights following surgery.  Keep a pillow propped under the elbow and forearm for comfort.  If you have a recliner type of chair it might be beneficial.  If not that is fine too, but it would be helpful to sleep propped up with pillows behind your operated shoulder as well under your elbow and forearm.  This will reduce pulling on the suture lines.   When dressing, put your operative arm in the sleeve first.  When getting undressed, take your operative arm out last.  Loose fitting, button-down shirts are recommended.  Often in the first days after surgery you may be more comfortable keeping your operative arm under your shirt and not through the sleeve.   You may return to work/school in the  next couple of days when you feel up to it.  Desk work and typing in the sling is fine.   We suggest you use the pain medication the first night prior to going to bed, in order to ease any pain when the anesthesia wears off. You should avoid taking pain medications on an empty stomach as it will make you nauseous.   You should wean off your narcotic medicines as soon as you are able.  Most patients will be off narcotics before their first postop appointment.    Do not drink alcoholic beverages or take illicit drugs when taking pain  medications.   It is against the law to drive while taking narcotics.  In some states it is against the law to drive while your arm is in a sling.    Pain medication may make you constipated.  Below are a few solutions to try in this order:   - Decrease the amount of pain medication if you aren't having pain.   - Drink lots of decaffeinated fluids.   - Drink prune juice and/or each dried prunes   If the first 3 don't work start with additional solutions   - Take Colace - an over-the-counter stool softener   - Take Senokot - an over-the-counter laxative   - Take Miralax - a stronger over-the-counter laxative   For more information including helpful videos and documents visit our website:   [email or web address]    Post Anesthesia Home Care Instructions  Activity: Get plenty of rest for the remainder of the day. A responsible individual must stay with you for 24 hours following the procedure.  For the next 24 hours, DO NOT: -Drive a car -Advertising copywriter -Drink alcoholic beverages -Take any medication unless instructed by your physician -Make any legal decisions or sign important papers.  Meals: Start with liquid foods such as gelatin or soup. Progress to regular foods as tolerated. Avoid greasy, spicy, heavy foods. If nausea and/or vomiting occur, drink only clear liquids until the nausea and/or vomiting subsides. Call your physician if vomiting continues.  Special Instructions/Symptoms: Your throat may feel dry or sore from the anesthesia or the breathing tube placed in your throat during surgery. If this causes discomfort, gargle with warm salt water. The discomfort should disappear within 24 hours.  If you had a scopolamine patch placed behind your ear for the management of post- operative nausea and/or vomiting:  1. The medication in the patch is effective for 72 hours, after which it should be removed.  Wrap patch in a tissue and discard in  the trash. Wash hands thoroughly with soap and water. 2. You may remove the patch earlier than 72 hours if you experience unpleasant side effects which may include dry mouth, dizziness or visual disturbances. 3. Avoid touching the patch. Wash your hands with soap and water after contact with the patch.    *May have Tylenol at 2:35pm

## 2024-08-19 ENCOUNTER — Other Ambulatory Visit: Payer: Self-pay

## 2024-08-19 ENCOUNTER — Encounter (HOSPITAL_BASED_OUTPATIENT_CLINIC_OR_DEPARTMENT_OTHER)
Admission: RE | Disposition: A | Discharge status: Home / Self Care | Payer: Self-pay | Source: Home / Self Care | Attending: Orthopaedic Surgery

## 2024-08-19 ENCOUNTER — Ambulatory Visit (HOSPITAL_BASED_OUTPATIENT_CLINIC_OR_DEPARTMENT_OTHER): Admitting: Anesthesiology

## 2024-08-19 ENCOUNTER — Encounter (HOSPITAL_BASED_OUTPATIENT_CLINIC_OR_DEPARTMENT_OTHER): Payer: Self-pay | Admitting: Orthopaedic Surgery

## 2024-08-19 ENCOUNTER — Ambulatory Visit (HOSPITAL_BASED_OUTPATIENT_CLINIC_OR_DEPARTMENT_OTHER)
Admission: RE | Admit: 2024-08-19 | Discharge: 2024-08-19 | Disposition: A | Discharge status: Home / Self Care | Attending: Orthopaedic Surgery | Admitting: Orthopaedic Surgery

## 2024-08-19 DIAGNOSIS — S46212A Strain of muscle, fascia and tendon of other parts of biceps, left arm, initial encounter: Secondary | ICD-10-CM

## 2024-08-19 DIAGNOSIS — X58XXXA Exposure to other specified factors, initial encounter: Secondary | ICD-10-CM | POA: Diagnosis not present

## 2024-08-19 DIAGNOSIS — K219 Gastro-esophageal reflux disease without esophagitis: Secondary | ICD-10-CM | POA: Diagnosis not present

## 2024-08-19 HISTORY — PX: DISTAL BICEPS TENDON REPAIR: SHX1461

## 2024-08-19 SURGERY — REPAIR, TENDON, BICEPS, DISTAL
Anesthesia: General | Site: Arm Lower | Laterality: Left

## 2024-08-19 MED ORDER — VANCOMYCIN HCL 1000 MG IV SOLR
INTRAVENOUS | Status: AC
  Filled 2024-08-19: qty 20

## 2024-08-19 MED ORDER — PROPOFOL 10 MG/ML IV BOLUS
INTRAVENOUS | Status: AC
  Filled 2024-08-19: qty 20

## 2024-08-19 MED ORDER — FENTANYL CITRATE (PF) 100 MCG/2ML IJ SOLN
INTRAMUSCULAR | Status: DC | PRN
  Administered 2024-08-19 (×2): 50 ug via INTRAVENOUS

## 2024-08-19 MED ORDER — CEFAZOLIN SODIUM-DEXTROSE 2-4 GM/100ML-% IV SOLN
INTRAVENOUS | Status: AC
  Filled 2024-08-19: qty 100

## 2024-08-19 MED ORDER — ACETAMINOPHEN 500 MG PO TABS: 1000.0000 mg | ORAL_TABLET | Freq: Three times a day (TID) | ORAL | 0 refills | Status: AC

## 2024-08-19 MED ORDER — EPHEDRINE 5 MG/ML INJ
INTRAVENOUS | Status: AC
  Filled 2024-08-19: qty 5

## 2024-08-19 MED ORDER — ONDANSETRON HCL 4 MG/2ML IJ SOLN
INTRAMUSCULAR | Status: DC | PRN
  Administered 2024-08-19: 4 mg via INTRAVENOUS

## 2024-08-19 MED ORDER — FENTANYL CITRATE (PF) 100 MCG/2ML IJ SOLN
INTRAMUSCULAR | Status: AC
  Filled 2024-08-19: qty 2

## 2024-08-19 MED ORDER — MIDAZOLAM HCL 2 MG/2ML IJ SOLN
INTRAMUSCULAR | Status: AC
  Filled 2024-08-19: qty 2

## 2024-08-19 MED ORDER — BUPIVACAINE HCL (PF) 0.25 % IJ SOLN
INTRAMUSCULAR | Status: DC | PRN
  Administered 2024-08-19: 20 mL

## 2024-08-19 MED ORDER — OXYCODONE HCL 5 MG PO TABS: 5.0000 mg | ORAL_TABLET | Freq: Once | ORAL | Status: DC | PRN

## 2024-08-19 MED ORDER — ACETAMINOPHEN 10 MG/ML IV SOLN: 1000.0000 mg | Freq: Once | INTRAVENOUS | Status: DC | PRN

## 2024-08-19 MED ORDER — OXYCODONE HCL 5 MG PO TABS: ORAL_TABLET | ORAL | 0 refills | Status: AC

## 2024-08-19 MED ORDER — CEFAZOLIN SODIUM-DEXTROSE 2-4 GM/100ML-% IV SOLN
2.0000 g | INTRAVENOUS | Status: AC
  Administered 2024-08-19: 2 g via INTRAVENOUS

## 2024-08-19 MED ORDER — BUPIVACAINE HCL (PF) 0.25 % IJ SOLN
INTRAMUSCULAR | Status: AC
  Filled 2024-08-19: qty 30

## 2024-08-19 MED ORDER — EPHEDRINE SULFATE (PRESSORS) 25 MG/5ML IV SOSY
PREFILLED_SYRINGE | INTRAVENOUS | Status: DC | PRN
  Administered 2024-08-19 (×2): 10 mg via INTRAVENOUS

## 2024-08-19 MED ORDER — ONDANSETRON HCL 4 MG PO TABS: 4.0000 mg | ORAL_TABLET | Freq: Three times a day (TID) | ORAL | 0 refills | Status: AC | PRN

## 2024-08-19 MED ORDER — DEXAMETHASONE SODIUM PHOSPHATE 4 MG/ML IJ SOLN
INTRAMUSCULAR | Status: DC | PRN
  Administered 2024-08-19: 5 mg via INTRAVENOUS

## 2024-08-19 MED ORDER — DROPERIDOL 2.5 MG/ML IJ SOLN: 0.6250 mg | Freq: Once | INTRAMUSCULAR | Status: DC | PRN

## 2024-08-19 MED ORDER — GABAPENTIN 300 MG PO CAPS
ORAL_CAPSULE | ORAL | Status: AC
  Filled 2024-08-19: qty 1

## 2024-08-19 MED ORDER — LIDOCAINE 2% (20 MG/ML) 5 ML SYRINGE
INTRAMUSCULAR | Status: AC
  Filled 2024-08-19: qty 5

## 2024-08-19 MED ORDER — PROPOFOL 10 MG/ML IV BOLUS
INTRAVENOUS | Status: DC | PRN
  Administered 2024-08-19: 50 mg via INTRAVENOUS
  Administered 2024-08-19: 200 mg via INTRAVENOUS

## 2024-08-19 MED ORDER — OXYCODONE HCL 5 MG/5ML PO SOLN: 5.0000 mg | Freq: Once | ORAL | Status: DC | PRN

## 2024-08-19 MED ORDER — LIDOCAINE HCL (CARDIAC) PF 100 MG/5ML IV SOSY
PREFILLED_SYRINGE | INTRAVENOUS | Status: DC | PRN
  Administered 2024-08-19: 60 mg via INTRAVENOUS

## 2024-08-19 MED ORDER — ONDANSETRON HCL 4 MG/2ML IJ SOLN
INTRAMUSCULAR | Status: AC
  Filled 2024-08-19: qty 2

## 2024-08-19 MED ORDER — CELECOXIB 100 MG PO CAPS: 100.0000 mg | ORAL_CAPSULE | Freq: Two times a day (BID) | ORAL | 0 refills | Status: AC

## 2024-08-19 MED ORDER — VANCOMYCIN HCL 1 G IV SOLR
INTRAVENOUS | Status: DC | PRN
  Administered 2024-08-19: 1000 mg via TOPICAL

## 2024-08-19 MED ORDER — ACETAMINOPHEN 500 MG PO TABS
1000.0000 mg | ORAL_TABLET | Freq: Once | ORAL | Status: AC
  Administered 2024-08-19: 1000 mg via ORAL

## 2024-08-19 MED ORDER — GABAPENTIN 300 MG PO CAPS
300.0000 mg | ORAL_CAPSULE | Freq: Once | ORAL | Status: AC
  Administered 2024-08-19: 300 mg via ORAL

## 2024-08-19 MED ORDER — LACTATED RINGERS IV SOLN: INTRAVENOUS | Status: DC

## 2024-08-19 MED ORDER — ACETAMINOPHEN 500 MG PO TABS
ORAL_TABLET | ORAL | Status: AC
  Filled 2024-08-19: qty 2

## 2024-08-19 MED ORDER — FENTANYL CITRATE (PF) 100 MCG/2ML IJ SOLN: 25.0000 ug | INTRAMUSCULAR | Status: DC | PRN

## 2024-08-19 SURGICAL SUPPLY — 50 items
ANCH BUTTON FIBERTAK KNEE SP (Anchor) IMPLANT
ANCHOR SUT FBRTK 2.6 SP #5 (Anchor) IMPLANT
BLADE SURG 10 STRL SS (BLADE) ×1 IMPLANT
BLADE SURG 15 STRL LF DISP TIS (BLADE) ×1 IMPLANT
BNDG COMPR ESMARK 4X3 LF (GAUZE/BANDAGES/DRESSINGS) ×1 IMPLANT
BNDG ELASTIC 4INX 5YD STR LF (GAUZE/BANDAGES/DRESSINGS) ×1 IMPLANT
CHLORAPREP W/TINT 26 (MISCELLANEOUS) ×1 IMPLANT
CLSR STERI-STRIP ANTIMIC 1/2X4 (GAUZE/BANDAGES/DRESSINGS) ×1 IMPLANT
CORD BIPOLAR FORCEPS 12FT (ELECTRODE) ×1 IMPLANT
CUFF TOURN SGL QUICK 18X3 (MISCELLANEOUS) ×1 IMPLANT
CUFF TRNQT CYL 24X4X16.5-23 (TOURNIQUET CUFF) IMPLANT
DRAPE EXTREMITY T 121X128X90 (DISPOSABLE) ×1 IMPLANT
DRAPE U-SHAPE 47X51 STRL (DRAPES) ×1 IMPLANT
ELECTRODE REM PT RTRN 9FT ADLT (ELECTROSURGICAL) ×1 IMPLANT
EXTENSION HOSE W/PLC CONNECTON (MISCELLANEOUS) ×1 IMPLANT
GAUZE SPONGE 4X4 12PLY STRL (GAUZE/BANDAGES/DRESSINGS) ×1 IMPLANT
GLOVE BIO SURGEON STRL SZ 6.5 (GLOVE) ×1 IMPLANT
GLOVE BIO SURGEON STRL SZ8 (GLOVE) ×1 IMPLANT
GLOVE BIOGEL PI IND STRL 6.5 (GLOVE) ×1 IMPLANT
GLOVE BIOGEL PI IND STRL 8 (GLOVE) ×1 IMPLANT
GLOVE ECLIPSE 8.0 STRL XLNG CF (GLOVE) ×1 IMPLANT
GOWN STRL REUS W/ TWL LRG LVL3 (GOWN DISPOSABLE) ×2 IMPLANT
GOWN STRL REUS W/TWL XL LVL3 (GOWN DISPOSABLE) ×1 IMPLANT
KIT KNEE FIBERTAK DISP (KITS) IMPLANT
KIT STR SPEAR 1.8 FBRTK DISP (KITS) IMPLANT
NDL MAYO CATGUT SZ4 TPR NDL (NEEDLE) ×1 IMPLANT
NEEDLE MAYO CATGUT SZ4 (NEEDLE) ×1 IMPLANT
PACK ARTHROSCOPY DSU (CUSTOM PROCEDURE TRAY) ×1 IMPLANT
PACK BASIN DAY SURGERY FS (CUSTOM PROCEDURE TRAY) ×1 IMPLANT
PAD CAST 4YDX4 CTTN HI CHSV (CAST SUPPLIES) ×2 IMPLANT
PENCIL SMOKE EVACUATOR (MISCELLANEOUS) ×1 IMPLANT
SHEET MEDIUM DRAPE 40X70 STRL (DRAPES) ×1 IMPLANT
SLEEVE SCD COMPRESS KNEE MED (STOCKING) ×1 IMPLANT
SLING ARM FOAM STRAP LRG (SOFTGOODS) IMPLANT
SLING ARM FOAM STRAP XLG (SOFTGOODS) IMPLANT
SOLN 0.9% NACL POUR BTL 1000ML (IV SOLUTION) ×1 IMPLANT
SPIKE FLUID TRANSFER (MISCELLANEOUS) IMPLANT
SPLINT PLASTER CAST FAST 5X30 (CAST SUPPLIES) ×10 IMPLANT
SPONGE T-LAP 18X18 ~~LOC~~+RFID (SPONGE) ×1 IMPLANT
SUCTION TUBE FRAZIER 10FR DISP (SUCTIONS) IMPLANT
SUT MNCRL AB 4-0 PS2 18 (SUTURE) ×1 IMPLANT
SUT VIC AB 0 CT1 27XBRD ANBCTR (SUTURE) ×1 IMPLANT
SUT VIC AB 2-0 CT1 TAPERPNT 27 (SUTURE) IMPLANT
SUT VIC AB 2-0 SH 27XBRD (SUTURE) IMPLANT
SUT VIC AB 3-0 SH 27X BRD (SUTURE) IMPLANT
SUTURE FIBERWR #2 38 T-5 BLUE (SUTURE) IMPLANT
SUTURE TAPE 1.3 FIBERLOP 20 ST (SUTURE) ×1 IMPLANT
SYR BULB EAR ULCER 3OZ GRN STR (SYRINGE) ×1 IMPLANT
TOWEL GREEN STERILE FF (TOWEL DISPOSABLE) ×1 IMPLANT
TUBE SUCTION HIGH CAP CLEAR NV (SUCTIONS) ×1 IMPLANT

## 2024-08-19 NOTE — Interval H&P Note (Signed)
 All questions answered, patient wants to proceed with procedure. ? ?

## 2024-08-19 NOTE — Op Note (Signed)
 Orthopaedic Surgery Operative Note (CSN: [id number])  Nathaniel Oliver  08/15/1966 Date of Surgery: 08/19/2024   Diagnoses:  LEFT BICEPS TENDON TEAR  Procedure: Left distal biceps repair [id number]   Operative Finding Successful completion of the planned procedure.  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  I was able to perform a onlay repair with good fixation and an elbow stable to about 40 degrees short of full extension.  Post-operative plan: The patient will be nonweightbearing in a splint transition to a hinged brace.  The patient will be discharged home.  DVT prophylaxis not indicated in this ambulatory upper extremity patient without significant risk factors.   Pain control with PRN pain medication preferring oral medicines.  Follow up plan will be scheduled in approximately 7 days for incision check and XR.  Post-Op Diagnosis: Same Surgeons:Primary: Cristy Bonner DASEN, MD Assistants:Caroline McBane, PA-C Location: [facility] OR ROOM 1 Anesthesia: General with local anesthesia Antibiotics: Ancef 2 g with local vancomycin powder 1 g at the surgical site Tourniquet time:  Total Tourniquet Time Documented: Upper Arm (Left) - 18 minutes Total: Upper Arm (Left) - 18 minutes  Estimated Blood Loss: Minimal Complications: None Specimens: None Implants: Implant Name Type Inv. Item Serial No. Manufacturer Lot No. LRB No. Used Action  ANCHOR SUT FBRTK 2.6 SP #5 - [id number] Anchor ANCHOR SUT FBRTK 2.6 SP #5  [employer] [id number] Left 1 Implanted  [facility] BUTTON [facility] KNEE SP - [id number] Anchor [facility] ROSABEL RUDDLE KNEE SP  [employer] [id number] Left 1 Implanted    Indications for Surgery:   Nathaniel Oliver is a 58 y.o. male with distal biceps rupture.  Benefits and risks of operative and nonoperative management were discussed prior to surgery with patient/guardian(s) and informed consent form was completed.  Specific risks including infection, need for additional surgery, rerupture,  neurovascular damage, stiffness amongst others.   Procedure:   The patient was identified properly. Informed consent was obtained and the surgical site was marked. The patient was taken up to suite where general anesthesia was induced.  The patient was positioned supine on a hand table.  The left elbow was prepped and draped in the usual sterile fashion.  Timeout was performed before the beginning of the case.  Tourniquet was used for the above duration.  We made a transverse 4 cm incision about 3 cm distal to the elbow crease and sharply incised the skin achieving hemostasis as we progressed.  We dissected the superficial subcutaneous tissue bluntly with a scissor isolating the mobilizing any superficial veins.  At this point we identified the interval between the pronator and the brachial radialis and bluntly dissected with finger dissection.  The lateral antebrachial cutaneous nerve was noted running along the radial border of the radial border of approach the approach with great care to avoid excess radially based retraction to avoid damage to this nerve.    Complete tear with retraction up the forearm but I was able to retrieve the tendon without a separate incision.  At this point the biceps tendon was pulled with traction using an Allis clamp and were able to mobilize it and identified that it had good excursion.    We used a #2 FiberWire style fiberloop and from proximal to distal performed a locking Krakw type suture moving in the button.  At this point we proceeded to place blunt retractors around the radial tuberosity and maximally supinate the arm to bring the tuberosity into view.  Was cleared of soft tissue and we  were able to place a 2.6 mm spade tipped wire unicortically.  We then placed a 2.6 biceps fibertak button.  We flipped it intramedullary and checked its deployment pulling back on the sutures.  We then placed a knotless 1.8 mm fibertak just 6-8 mm proximal on the tuberosity.   We checked its fixation after placement.   At that point we shuttled the fiberloop sutures through the corresponding passing sutures for the fibertak button and then were able to shuttle the tendon down to bone.  We passed one limb through the tendon and tied alternating half hitches securing the tendon.  We then used the knotless 1.8 fibertak working limb to obtain fixation 6-8 mm proximal on the tendon and were able to secure a large footprint of tendon on bone which had been previously prepared for healing.  We cut excess suture and were happy with the construct.   We irrigated the wound copiously before placing local antibiotic as listed above.  We closed the incision in a multilayer fashion with absorbable suture.  Sterile dressing was placed.  Splint placed.  Patient was awoken taken to PACU in stable condition.  Aleck Stalling, PA-C, present and scrubbed throughout the case, critical for completion in a timely fashion, and for retraction, instrumentation, closure.

## 2024-08-19 NOTE — Anesthesia Preprocedure Evaluation (Signed)
 Anesthesia Evaluation  Patient identified by MRN, date of birth, ID band Patient awake    Reviewed: Allergy & Precautions, NPO status , Patient's Chart, lab work & pertinent test results  History of Anesthesia Complications Negative for: history of anesthetic complications  Airway Mallampati: II  TM Distance: >3 FB Neck ROM: Full    Dental no notable dental hx. (+) Teeth Intact   Pulmonary neg pulmonary ROS, neg sleep apnea, neg COPD, Patient abstained from smoking.Not current smoker   Pulmonary exam normal breath sounds clear to auscultation       Cardiovascular Exercise Tolerance: Good METS(-) hypertension(-) CAD and (-) Past MI (-) dysrhythmias  Rhythm:Regular Rate:Normal - Systolic murmurs    Neuro/Psych negative neurological ROS  negative psych ROS   GI/Hepatic ,GERD  Controlled,,(+)     (-) substance abuse    Endo/Other  neg diabetesHypothyroidism    Renal/GU negative Renal ROS     Musculoskeletal   Abdominal   Peds  Hematology   Anesthesia Other Findings Past Medical History: No date: Anginal pain No date: Chest pain No date: Dyspnea No date: Fatigue No date: GERD (gastroesophageal reflux disease) No date: Hypothyroidism No date: SOB (shortness of breath)  Reproductive/Obstetrics                              Anesthesia Physical Anesthesia Plan  ASA: 2  Anesthesia Plan: General   Post-op Pain Management: Tylenol PO (pre-op)*, Gabapentin PO (pre-op)* and Toradol IV (intra-op)*   Induction: Intravenous  PONV Risk Score and Plan: 2 and Ondansetron and Dexamethasone  Airway Management Planned: LMA  Additional Equipment: None  Intra-op Plan:   Post-operative Plan: Extubation in OR  Informed Consent: I have reviewed the patients History and Physical, chart, labs and discussed the procedure including the risks, benefits and alternatives for the proposed anesthesia  with the patient or authorized representative who has indicated his/her understanding and acceptance.     Dental advisory given  Plan Discussed with: CRNA and Surgeon  Anesthesia Plan Comments: (Discussed risks of anesthesia with patient, including PONV, sore throat, lip/dental/eye damage. Rare risks discussed as well, such as cardiorespiratory and neurological sequelae, and allergic reactions. Discussed the role of CRNA in patient's perioperative care. Patient understands.  Patient requests limiting medicines that can have a prolonged groggy effect on him. I advised we would avoid midazolam .)         Anesthesia Quick Evaluation

## 2024-08-19 NOTE — Anesthesia Postprocedure Evaluation (Signed)
 Anesthesia Post Note  Patient: Nathaniel Oliver  Procedure(s) Performed: REPAIR, TENDON, BICEPS, DISTAL (Left: Arm Lower)     Patient location during evaluation: PACU Anesthesia Type: General Level of consciousness: awake and alert Pain management: pain level controlled Vital Signs Assessment: post-procedure vital signs reviewed and stable Respiratory status: spontaneous breathing, nonlabored ventilation and respiratory function stable Cardiovascular status: blood pressure returned to baseline Postop Assessment: no apparent nausea or vomiting Anesthetic complications: no   No notable events documented.  Last Vitals:  Vitals:   08/19/24 1110 08/19/24 1115  BP:    Pulse: (!) 59 61  Resp: 15 17  Temp:    SpO2: 94% 95%    Last Pain:  Vitals:   08/19/24 1115  TempSrc:   PainSc: 0-No pain                 Vertell Row

## 2024-08-19 NOTE — Anesthesia Procedure Notes (Signed)
 Procedure Name: LMA Insertion Date/Time: 08/19/2024 9:54 AM  Performed by: Pam Macario BROCKS, CRNAPre-anesthesia Checklist: Patient identified, Emergency Drugs available, Suction available, Patient being monitored and Timeout performed Patient Re-evaluated:Patient Re-evaluated prior to induction Oxygen Delivery Method: Circle system utilized Preoxygenation: Pre-oxygenation with 100% oxygen Induction Type: IV induction Ventilation: Mask ventilation without difficulty LMA: LMA inserted LMA Size: 4.0 Number of attempts: 1 Airway Equipment and Method: Bite block Placement Confirmation: positive ETCO2, breath sounds checked- equal and bilateral and CO2 detector Tube secured with: Tape Dental Injury: Teeth and Oropharynx as per pre-operative assessment

## 2024-08-19 NOTE — Transfer of Care (Signed)
 Immediate Anesthesia Transfer of Care Note  Patient: Nathaniel Oliver  Procedure(s) Performed: REPAIR, TENDON, BICEPS, DISTAL (Left: Arm Lower)  Patient Location: PACU  Anesthesia Type:General  Level of Consciousness: awake, alert , and oriented  Airway & Oxygen Therapy: Patient Spontanous Breathing and Patient connected to face mask oxygen  Post-op Assessment: Report given to RN and Post -op Vital signs reviewed and stable  Post vital signs: Reviewed and stable  Last Vitals:  Vitals Value Taken Time  BP    Temp    Pulse 64 08/19/24 10:55  Resp    SpO2 97 % 08/19/24 10:55  Vitals shown include unfiled device data.  Last Pain:  Vitals:   08/19/24 0831  TempSrc: Tympanic  PainSc: 1          Complications: No notable events documented.

## 2024-08-20 ENCOUNTER — Encounter (HOSPITAL_BASED_OUTPATIENT_CLINIC_OR_DEPARTMENT_OTHER): Payer: Self-pay | Admitting: Orthopaedic Surgery

## 2024-09-01 ENCOUNTER — Inpatient Hospital Stay: Attending: Hematology

## 2024-09-01 DIAGNOSIS — C8800 Waldenstrom macroglobulinemia not having achieved remission: Secondary | ICD-10-CM | POA: Insufficient documentation

## 2024-09-01 LAB — CBC WITH DIFFERENTIAL (CANCER CENTER ONLY)
Abs Immature Granulocytes: 0.01 K/uL (ref 0.00–0.07)
Basophils Absolute: 0.1 K/uL (ref 0.0–0.1)
Basophils Relative: 1 %
Eosinophils Absolute: 0.4 K/uL (ref 0.0–0.5)
Eosinophils Relative: 5 %
HCT: 37 % — ABNORMAL LOW (ref 39.0–52.0)
Hemoglobin: 12.7 g/dL — ABNORMAL LOW (ref 13.0–17.0)
Immature Granulocytes: 0 %
Lymphocytes Relative: 28 %
Lymphs Abs: 2 K/uL (ref 0.7–4.0)
MCH: 32.1 pg (ref 26.0–34.0)
MCHC: 34.3 g/dL (ref 30.0–36.0)
MCV: 93.4 fL (ref 80.0–100.0)
Monocytes Absolute: 0.6 K/uL (ref 0.1–1.0)
Monocytes Relative: 8 %
Neutro Abs: 4.1 K/uL (ref 1.7–7.7)
Neutrophils Relative %: 58 %
Platelet Count: 297 K/uL (ref 150–400)
RBC: 3.96 MIL/uL — ABNORMAL LOW (ref 4.22–5.81)
RDW: 11.9 % (ref 11.5–15.5)
WBC Count: 7.1 K/uL (ref 4.0–10.5)
nRBC: 0 % (ref 0.0–0.2)

## 2024-09-01 LAB — CMP (CANCER CENTER ONLY)
ALT: 18 U/L (ref 0–44)
AST: 24 U/L (ref 15–41)
Albumin: 4.1 g/dL (ref 3.5–5.0)
Alkaline Phosphatase: 65 U/L (ref 38–126)
Anion gap: 7 (ref 5–15)
BUN: 17 mg/dL (ref 6–20)
CO2: 30 mmol/L (ref 22–32)
Calcium: 9.4 mg/dL (ref 8.9–10.3)
Chloride: 103 mmol/L (ref 98–111)
Creatinine: 1.11 mg/dL (ref 0.61–1.24)
GFR, Estimated: >60 mL/min (ref >60)
Glucose, Bld: 98 mg/dL (ref 70–99)
Potassium: 4.1 mmol/L (ref 3.5–5.1)
Sodium: 140 mmol/L (ref 135–145)
Total Bilirubin: 0.7 mg/dL (ref 0.0–1.2)
Total Protein: 8 g/dL (ref 6.5–8.1)

## 2024-09-01 LAB — LACTATE DEHYDROGENASE: LDH: 119 U/L (ref 105–235)

## 2024-09-02 LAB — KAPPA/LAMBDA LIGHT CHAINS
Kappa free light chain: 19.4 mg/L (ref 3.3–19.4)
Kappa, lambda light chain ratio: 2.11 — ABNORMAL HIGH (ref 0.26–1.65)
Lambda free light chains: 9.2 mg/L (ref 5.7–26.3)

## 2024-09-05 LAB — MULTIPLE MYELOMA PANEL, SERUM
Albumin SerPl Elph-Mcnc: 3.7 g/dL (ref 2.9–4.4)
Albumin/Glob SerPl: 1 (ref 0.7–1.7)
Alpha 1: 0.2 g/dL (ref 0.0–0.4)
Alpha2 Glob SerPl Elph-Mcnc: 0.5 g/dL (ref 0.4–1.0)
B-Globulin SerPl Elph-Mcnc: 0.7 g/dL (ref 0.7–1.3)
Gamma Glob SerPl Elph-Mcnc: 2.5 g/dL — ABNORMAL HIGH (ref 0.4–1.8)
Globulin, Total: 3.8 g/dL (ref 2.2–3.9)
IgA: 92 mg/dL (ref 90–386)
IgG (Immunoglobin G), Serum: 690 mg/dL (ref 603–1613)
IgM (Immunoglobulin M), Srm: 2501 mg/dL — ABNORMAL HIGH (ref 20–172)
M Protein SerPl Elph-Mcnc: 2 g/dL — ABNORMAL HIGH
Total Protein ELP: 7.5 g/dL (ref 6.0–8.5)

## 2024-09-08 ENCOUNTER — Inpatient Hospital Stay: Admitting: Hematology

## 2024-09-08 DIAGNOSIS — C8800 Waldenstrom macroglobulinemia not having achieved remission: Secondary | ICD-10-CM | POA: Insufficient documentation

## 2024-09-08 NOTE — Progress Notes (Signed)
 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 male who has been referred to us  by Camie Mirza, PA at [facility] for evaluation and management of abnormal serum protein. The pt reports that he is doing well overall. We are joined today by his wife.   The pt reports that he has a history of hypothyroidism. The pt notes that he had Shingles in July 2021 and everything was normal prior to this. The pt notes he had visited the doctor one week prior and felt great. This was fairly bad on his back and was not triggered by any steroid use. The pt notes he had recently had more work stress and personal stress with addition of a puppy. The pt notes he recovered in 4 weeks. The pt notes that again in November he experienced a cold for two days. The pt then notes that he felt a bronchial spasm with fluttering in his heart in December, helped with antibiotics. The pt notes he never used to be sick prior. The pt notes that his urine has also been brown and foamy persistent since around the same time as Shingles. The pt notes this is worse in the morning and night. He notes this is different than what he was used to, but not overly foamy. The pt notes that since these recent finding, he has not slept well and has been very anxious regarding the results and potential diagnosis. The pt also notes that he had a past medical issue with lack of recovery in 2016 and had to stop his competitive cycling. He would ride for an hour-1.5 hours then get extremely fatigued when he would push himself. This did not affect his regular day life. The pt notes that on blood work he had EBV antibody elevated. They were checking for chronic fatigue  syndrome in doing all of these lab tests. He notes they could not explain his extreme fatigue and change from baseline. The pt notes that since then he has been on many vitamins and supplements that have been helping him. The pt notes that currently he lifts weight and is feeling fine. The pt notes bronchial allergies as a kid, but no asthma. He denies needing inhalers for this. The pt has been having hypothyroidism since 2010, but is unknown of what the cause is. The pt denies any medication allergies or surgeries in the past. The pt notes he has been on testosterone since 2016 when he was experiencing fatigue. He is using .5cc of the intramuscular injections every 4-5 days. The pt notes that his integrative doctor is monitoring this and his levels are now in the 700s. The pt notes his father passed away from pancreatic cancer in his mid 22s, mother had hypertension and high cholesterol and history of familial stroke. The pt notes his father smoked over a pack a day for an extended period of time. The pt has had the [facility] testing that checks for mutations and found nothing detected.    The pt denies any history of smoking and notes he would occasionally drink beers on weekends. He notes that in his adult life he has been very healthy and proactive. The pt notes that the fluttering is improved, but occurs when he is around his dogs. The pt  has three big dogs that are downstairs dogs. The pt notes that he has had GERD since his mid 68s and gets bloated after eating. 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   Lab results 12/21/2020 showed an m-spike of 1.8 and IgM of 2,397. 12/21/2020 CMP WNL. 11/20/2020 CBC WNL.   On review of systems, pt reports bloating, difficulty sleeping and denies fatigue, fevers, chills, night sweats, sudden weight loss, new lumps/bumps, skin rashes, persistent pain in area of shingles, change in vision, acute headaches, floaters, sudden change in vision, acute  SOB, chest pain, leg swelling, abdominal pain, back pain, acute bone pains, testicular pain/swelling, and any other symptoms.  INTERVAL HISTORY: I connected with [name] on 09/08/2024 at  3:30 PM EST by telephone and verified that I am speaking with the correct person using two identifiers.   I discussed the limitations, risks, security and privacy concerns of performing an evaluation and management service by telemedicine and the availability of in-person appointments. I also discussed with the patient that there may be a patient responsible charge related to this service. The patient expressed understanding and agreed to proceed.   Other persons participating in the visit and their role in the encounter: Medical Scribe, Damien Blanks.  Patient's location: Home Provider's location: [facility]   Chief Complaint: continued evaluation and management of smoldering Waldenstrm's macroglobulinemia.  I last connected with him on 05/31/2024; at the time he did not have any concerns and was doing well.    Today, he says that he has been doing well overall, though states that about 3 weeks ago he ruptured his bicep tendon after trying to catch a heavy box. He is s/p tendon repair. Notes that he is having some difficulty sleeping due to his brace.   REVIEW OF SYSTEMS:   10 Point review of systems of done and is negative except as noted above.  MEDICAL HISTORY Past Medical History:  Diagnosis Date   Anginal pain    Chest pain    Dyspnea    Fatigue    GERD (gastroesophageal reflux disease)    Hypothyroidism    SOB (shortness of breath)     SURGICAL HISTORY Past Surgical History:  Procedure Laterality Date   DISTAL BICEPS TENDON REPAIR Left 08/19/2024   Procedure: REPAIR, TENDON, BICEPS, DISTAL;  Surgeon: Cristy Bonner DASEN, MD;  Location: [facility];  Service: Orthopedics;  Laterality: Left;   NO PAST SURGERIES      SOCIAL HISTORY Social History   Tobacco Use   Smoking status:  Never   Smokeless tobacco: Never  Vaping Use   Vaping status: Never Used  Substance Use Topics   Alcohol use: Not Currently   Drug use: Never    Social History   Social History Narrative   Not on file    SOCIAL DRIVERS OF HEALTH SDOH Screenings   Food Insecurity: No Food Insecurity (03/28/2023)   Received from [facility]  Transportation Needs: No Transportation Needs (03/28/2023)   Received from [facility]  Utilities: Not At Risk (03/28/2023)   Received from [facility]  Financial Resource Strain: Low Risk  (03/28/2023)   Received from [facility]  Physical Activity: Sufficiently Active (03/28/2023)   Received from [facility]  Social Connections: Socially Integrated (03/28/2023)   Received from [facility]  Stress: No Stress Concern Present (03/28/2023)   Received from [facility]  Tobacco Use: Low Risk  (08/19/2024)     FAMILY HISTORY Family History  Problem Relation Age of Onset   Colon polyps Mother    Hypertension Mother    Stroke Mother    Hyperlipidemia Mother    Pancreatic cancer Father    Healthy Sister    Healthy Brother    Colon cancer Neg Hx      ALLERGIES: is allergic to fish allergy.  MEDICATIONS  Current Outpatient Medications  Medication Sig Dispense Refill   Ascorbic Acid (VITAMIN C PO) Take by mouth.     celecoxib  (CELEBREX ) 100 MG capsule Take 1 capsule (100 mg total) by mouth 2 (two) times daily. For 2 weeks. Then take as needed 60 capsule 0   EPINEPHrine 0.3 mg/0.3 mL IJ SOAJ injection Inject 1 Device into the muscle once as needed. Inject into thigh as need for allergic reaction.Seek medical attention after using. (Patient not taking: Reported on 01/03/2021)     Ergocalciferol 50 MCG (2000 UT) CAPS Take by mouth.     Multiple Vitamin (MULTIVITAMIN) tablet Take 1 tablet by mouth daily.     testosterone cypionate (DEPOTESTOSTERONE CYPIONATE) 200 MG/ML injection once a week.     thyroid (ARMOUR) 30 MG tablet Take by mouth.      zinc gluconate 50 MG tablet Take by mouth.     Current Facility-Administered Medications  Medication Dose Route Frequency Provider Last Rate Last Admin   0.9 %  sodium chloride  infusion  500 mL Intravenous Once Stacia Glendia BRAVO, MD        PHYSICAL EXAMINATION TELEPHONE VISIT:  GENERAL: sounds alert, in no acute distress and comfortable PSYCH: sounds alert & oriented x 3 with fluent speech  LABORATORY DATA:   I have reviewed the data as listed     Latest Ref Rng & Units 09/01/2024    3:58 PM 05/24/2024    3:47 PM 01/19/2024    4:06 PM  CBC EXTENDED  WBC 4.0 - 10.5 K/uL 7.1  8.8  7.7   RBC 4.22 - 5.81 MIL/uL 3.96  4.17  4.29   Hemoglobin 13.0 - 17.0 g/dL 87.2  86.6  86.4   HCT 39.0 - 52.0 % 37.0  39.1  39.5   Platelets 150 - 400 K/uL 297  355  282   NEUT# 1.7 - 7.7 K/uL 4.1  6.1  4.5   Lymph# 0.7 - 4.0 K/uL 2.0  1.7  2.0    LDH: Lab Results  Component Value Date   LDH 119 09/01/2024      Latest Ref Rng & Units 09/01/2024    3:58 PM 05/24/2024    3:47 PM 01/19/2024    4:06 PM  CMP  Glucose 70 - 99 mg/dL 98  895  80   BUN 6 - 20 mg/dL 17  11  13    Creatinine 0.61 - 1.24 mg/dL 8.88  8.88  8.90   Sodium 135 - 145 mmol/L 140  135  138   Potassium 3.5 - 5.1 mmol/L 4.1  4.1  4.3   Chloride 98 - 111 mmol/L 103  100  104   CO2 22 - 32 mmol/L 30  30  31    Calcium 8.9 - 10.3 mg/dL 9.4  9.7  9.9   Total Protein 6.5 - 8.1 g/dL 8.0  8.1  8.2   Total Bilirubin 0.0 - 1.2 mg/dL 0.7  0.7  0.6   Alkaline Phos 38 - 126 U/L 65  60  52   AST 15 - 41 U/L 24  17  20    ALT 0 - 44  U/L 18  13  14     MULTIPLE MYELOMA 10/2021 - 08/2024 & KAPPA/LAMBDA LIGHT CHAINS 12/2020 - 08/2024    01/23/2021 Surgical Pathology   -Monoclonal B cell population identified  -No aberrant T-cell phenotype identified  -See comment   COMMENT:  Flow cytometric analysis of the lymphoid population representing 19% of  all cells in the sample shows a monoclonal, kappa restricted B-cell  population  representing 5% of all cells and expressing B-cell antigens  including CD20 with lack of CD5, CD10, CD25, CD38 or CD103 expression.  The pattern is nonspecific but consistent with involvement by a B-cell  lymphoproliferative process.  Admixed T-cells do not show an aberrant  phenotype.   RADIOGRAPHIC STUDIES: I have personally reviewed the radiological images as listed and agreed with the findings in the report. US  LIMITED JOINT SPACE STRUCTURES UP LEFT Result Date: 08/12/2024 Limited MSK ultrasound left elbow Date: 08/12/2024 Indication: Left elbow pain, rule out distal biceps rupture Findings: - Examination of the anterior elbow reveals normal-appearing biceps muscle fibers, anterior view shows disorganization of the distal biceps tendon.  Pronator view shows poor visualization of distal biceps tendon, as well as cobra view.  Does have some surrounding edema in the anterior elbow.  Impression: - Left distal biceps rupture Images and interpretation completed by Rainell Cedar, DO    ASSESSMENT & PLAN:  58 y.o. 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   Mild anemia consistent with expected blood loss during his tendon repair.  CMP stable.  M protein stable at 2.0 and Kappa/Lambda Lights Chains with elevated Ratio LDH 119  FOLLOW-UP in 6 months for labs and follow-up with Dr. Onesimo.  The total time spent in the appointment was *** minutes*  .  All of the patient's questions were answered and the patient knows to call the clinic with any problems, questions, or concerns.  Emaline Onesimo MD MS AAHIVMS [facility] Hematology/Oncology Physician [facility]  *Total Encounter Time as defined by the Centers for Medicare and Medicaid Services includes, in addition to the face-to-face time of a patient visit (documented in the note above) non-face-to-face time: obtaining and reviewing outside history, ordering and reviewing medications, tests or procedures, care coordination (communications with other health care professionals or caregivers) and documentation in the medical record.  I,Emily Lagle,acting as a neurosurgeon for Emaline Onesimo, MD.,have documented all relevant documentation on the behalf of Emaline Onesimo, MD,as directed by  Emaline Onesimo, MD while in the presence of Emaline Onesimo, MD.  I have reviewed the above documentation for accuracy and completeness, and I agree with the above.  Larell Baney, MD

## 2024-09-14 NOTE — Progress Notes (Incomplete)
 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 male who has been referred to us  by Nathaniel Mirza, PA at [facility] for evaluation and management of abnormal serum protein. The pt reports that he is doing well overall. We are joined today by his wife.   The pt reports that he has a history of hypothyroidism. The pt notes that he had Shingles in July 2021 and everything was normal prior to this. The pt notes he had visited the doctor one week prior and felt great. This was fairly bad on his back and was not triggered by any steroid use. The pt notes he had recently had more work stress and personal stress with addition of a puppy. The pt notes he recovered in 4 weeks. The pt notes that again in November he experienced a cold for two days. The pt then notes that he felt a bronchial spasm with fluttering in his heart in December, helped with antibiotics. The pt notes he never used to be sick prior. The pt notes that his urine has also been brown and foamy persistent since around the same time as Shingles. The pt notes this is worse in the morning and night. He notes this is different than what he was used to, but not overly foamy. The pt notes that since these recent finding, he has not slept well and has been very anxious regarding the results and potential diagnosis. The pt also notes that he had a past medical issue with lack of recovery in 2016 and had to stop his competitive cycling. He would ride for an hour-1.5 hours then get extremely fatigued when he would push himself. This did not affect his regular day life. The pt notes that on blood work he had EBV antibody elevated. They were checking for chronic fatigue  syndrome in doing all of these lab tests. He notes they could not explain his extreme fatigue and change from baseline. The pt notes that since then he has been on many vitamins and supplements that have been helping him. The pt notes that currently he lifts weight and is feeling fine. The pt notes bronchial allergies as a kid, but no asthma. He denies needing inhalers for this. The pt has been having hypothyroidism since 2010, but is unknown of what the cause is. The pt denies any medication allergies or surgeries in the past. The pt notes he has been on testosterone since 2016 when he was experiencing fatigue. He is using .5cc of the intramuscular injections every 4-5 days. The pt notes that his integrative doctor is monitoring this and his levels are now in the 700s. The pt notes his father passed away from pancreatic cancer in his mid 13s, mother had hypertension and high cholesterol and history of familial stroke. The pt notes his father smoked over a pack a day for an extended period of time. The pt has had the [facility] testing that checks for mutations and found nothing detected.    The pt denies any history of smoking and notes he would occasionally drink beers on weekends. He notes that in his adult life he has been very healthy and proactive. The pt notes that the fluttering is improved, but occurs when he is around his dogs. The pt  has three big dogs that are downstairs dogs. The pt notes that he has had GERD since his mid 30s and gets bloated after eating. 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   Lab results 12/21/2020 showed an m-spike of 1.8 and IgM of 2,397. 12/21/2020 CMP WNL. 11/20/2020 CBC WNL.   On review of systems, pt reports bloating, difficulty sleeping and denies fatigue, fevers, chills, night sweats, sudden weight loss, new lumps/bumps, skin rashes, persistent pain in area of shingles, change in vision, acute headaches, floaters, sudden change in vision, acute  SOB, chest pain, leg swelling, abdominal pain, back pain, acute bone pains, testicular pain/swelling, and any other symptoms.  INTERVAL HISTORY: I connected with Nathaniel Oliver on 09/08/2024 at  3:30 PM EST by telephone and verified that I am speaking with the correct person using two identifiers.   I discussed the limitations, risks, security and privacy concerns of performing an evaluation and management service by telemedicine and the availability of in-person appointments. I also discussed with the patient that there may be a patient responsible charge related to this service. The patient expressed understanding and agreed to proceed.   Other persons participating in the visit and their role in the encounter: Medical Scribe, Nathaniel Oliver.  Patient's location: Home Provider's location: [facility]   Chief Complaint: continued evaluation and management of smoldering Waldenstrm's macroglobulinemia.  I last connected with him on 05/31/2024; at the time he did not have any concerns and was doing well.    Today, he says that he has been doing well overall, though states that about 3 weeks ago he ruptured his bicep tendon after trying to catch a heavy box. He is s/p tendon repair. Notes that he is having some difficulty sleeping due to his brace.   REVIEW OF SYSTEMS:   10 Point review of systems of done and is negative except as noted above.  MEDICAL HISTORY Past Medical History:  Diagnosis Date  . Anginal pain   . Chest pain   . Dyspnea   . Fatigue   . GERD (gastroesophageal reflux disease)   . Hypothyroidism   . SOB (shortness of breath)     SURGICAL HISTORY Past Surgical History:  Procedure Laterality Date  . DISTAL BICEPS TENDON REPAIR Left 08/19/2024   Procedure: REPAIR, TENDON, BICEPS, DISTAL;  Surgeon: Nathaniel Bonner DASEN, MD;  Location: [facility];  Service: Orthopedics;  Laterality: Left;  . NO PAST SURGERIES      SOCIAL HISTORY Social History   Tobacco Use  . Smoking  status: Never  . Smokeless tobacco: Never  Vaping Use  . Vaping status: Never Used  Substance Use Topics  . Alcohol use: Not Currently  . Drug use: Never    Social History   Social History Narrative  . Not on file    SOCIAL DRIVERS OF HEALTH SDOH Screenings   Food Insecurity: No Food Insecurity (03/28/2023)   Received from [facility]  Transportation Needs: No Transportation Needs (03/28/2023)   Received from [facility]  Utilities: Not At Risk (03/28/2023)   Received from [facility]  Financial Resource Strain: Low Risk  (03/28/2023)   Received from [facility]  Physical Activity: Sufficiently Active (03/28/2023)   Received from [facility]  Social Connections: Socially Integrated (03/28/2023)   Received from [facility]  Stress: No Stress Concern Present (03/28/2023)   Received from [facility]  Tobacco Use: Low Risk  (08/19/2024)     FAMILY HISTORY Family History  Problem Relation Age of Onset  . Colon polyps Mother   . Hypertension Mother   . Stroke Mother   . Hyperlipidemia Mother   . Pancreatic cancer Father   . Healthy Sister   . Healthy Brother   . Colon cancer Neg Hx      ALLERGIES: is allergic to fish allergy.  MEDICATIONS  Current Outpatient Medications  Medication Sig Dispense Refill  . Ascorbic Acid (VITAMIN C PO) Take by mouth.    . celecoxib  (CELEBREX ) 100 MG capsule Take 1 capsule (100 mg total) by mouth 2 (two) times daily. For 2 weeks. Then take as needed 60 capsule 0  . EPINEPHrine 0.3 mg/0.3 mL IJ SOAJ injection Inject 1 Device into the muscle once as needed. Inject into thigh as need for allergic reaction.Seek medical attention after using. (Patient not taking: Reported on 01/03/2021)    . Ergocalciferol 50 MCG (2000 UT) CAPS Take by mouth.    . Multiple Vitamin (MULTIVITAMIN) tablet Take 1 tablet by mouth daily.    SABRA testosterone cypionate (DEPOTESTOSTERONE CYPIONATE) 200 MG/ML injection once a week.    . thyroid (ARMOUR) 30 MG  tablet Take by mouth.    . zinc gluconate 50 MG tablet Take by mouth.     Current Facility-Administered Medications  Medication Dose Route Frequency Provider Last Rate Last Admin  . 0.9 %  sodium chloride  infusion  500 mL Intravenous Once Stacia Glendia BRAVO, MD        PHYSICAL EXAMINATION TELEPHONE VISIT:  GENERAL: sounds alert, in no acute distress and comfortable PSYCH: sounds alert & oriented x 3 with fluent speech  LABORATORY DATA:   I have reviewed the data as listed     Latest Ref Rng & Units 09/01/2024    3:58 PM 05/24/2024    3:47 PM 01/19/2024    4:06 PM  CBC EXTENDED  WBC 4.0 - 10.5 K/uL 7.1  8.8  7.7   RBC 4.22 - 5.81 MIL/uL 3.96  4.17  4.29   Hemoglobin 13.0 - 17.0 g/dL 87.2  86.6  86.4   HCT 39.0 - 52.0 % 37.0  39.1  39.5   Platelets 150 - 400 K/uL 297  355  282   NEUT# 1.7 - 7.7 K/uL 4.1  6.1  4.5   Lymph# 0.7 - 4.0 K/uL 2.0  1.7  2.0    LDH: Lab Results  Component Value Date   LDH 119 09/01/2024      Latest Ref Rng & Units 09/01/2024    3:58 PM 05/24/2024    3:47 PM 01/19/2024    4:06 PM  CMP  Glucose 70 - 99 mg/dL 98  895  80   BUN 6 - 20 mg/dL 17  11  13    Creatinine 0.61 - 1.24 mg/dL 8.88  8.88  8.90   Sodium 135 - 145 mmol/L 140  135  138   Potassium 3.5 - 5.1 mmol/L 4.1  4.1  4.3   Chloride 98 - 111 mmol/L 103  100  104   CO2 22 - 32 mmol/L 30  30  31    Calcium 8.9 - 10.3 mg/dL 9.4  9.7  9.9   Total Protein 6.5 - 8.1 g/dL 8.0  8.1  8.2   Total Bilirubin 0.0 - 1.2 mg/dL 0.7  0.7  0.6   Alkaline Phos 38 - 126 U/L 65  60  52   AST 15 - 41 U/L 24  17  20    ALT 0 - 44  U/L 18  13  14     MULTIPLE MYELOMA 10/2021 - 08/2024 & KAPPA/LAMBDA LIGHT CHAINS 12/2020 - 08/2024    01/23/2021 Surgical Pathology   -Monoclonal B cell population identified  -No aberrant T-cell phenotype identified  -See comment   COMMENT:  Flow cytometric analysis of the lymphoid population representing 19% of  all cells in the sample shows a monoclonal, kappa restricted  B-cell  population representing 5% of all cells and expressing B-cell antigens  including CD20 with lack of CD5, CD10, CD25, CD38 or CD103 expression.  The pattern is nonspecific but consistent with involvement by a B-cell  lymphoproliferative process.  Admixed T-cells do not show an aberrant  phenotype.   RADIOGRAPHIC STUDIES: I have personally reviewed the radiological images as listed and agreed with the findings in the report. US  LIMITED JOINT SPACE STRUCTURES UP LEFT Result Date: 08/12/2024 Limited MSK ultrasound left elbow Date: 08/12/2024 Indication: Left elbow pain, rule out distal biceps rupture Findings: - Examination of the anterior elbow reveals normal-appearing biceps muscle fibers, anterior view shows disorganization of the distal biceps tendon.  Pronator view shows poor visualization of distal biceps tendon, as well as cobra view.  Does have some surrounding edema in the anterior elbow.  Impression: - Left distal biceps rupture Images and interpretation completed by Rainell Cedar, DO    ASSESSMENT & PLAN:  58 y.o. 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   Mild anemia consistent with expected blood loss during his tendon repair.  CMP stable.  M protein stable at 2.0 and Kappa/Lambda Lights Chains with elevated Ratio LDH 119  FOLLOW-UP in 6 months for labs and follow-up with Dr. Onesimo.  The total time spent in the appointment  was *** minutes* .  All of the patient's questions were answered and the patient knows to call the clinic with any problems, questions, or concerns.  Emaline Onesimo MD MS AAHIVMS [facility] Hematology/Oncology Physician [facility]  *Total Encounter Time as defined by the Centers for Medicare and Medicaid Services includes, in addition to the face-to-face time of a patient visit (documented in the note above) non-face-to-face time: obtaining and reviewing outside history, ordering and reviewing medications, tests or procedures, care coordination (communications with other health care professionals or caregivers) and documentation in the medical record.  I,Emily Lagle,acting as a neurosurgeon for Emaline Onesimo, MD.,have documented all relevant documentation on the behalf of Emaline Onesimo, MD,as directed by  Emaline Onesimo, MD while in the presence of Emaline Onesimo, MD.  I have reviewed the above documentation for accuracy and completeness, and I agree with the above.  Raidyn Wassink, MD

## 2024-11-03 ENCOUNTER — Encounter: Payer: Self-pay | Admitting: Family Medicine

## 2025-03-09 ENCOUNTER — Inpatient Hospital Stay

## 2025-03-16 ENCOUNTER — Inpatient Hospital Stay: Admitting: Hematology
